# Patient Record
Sex: Male | Born: 1990 | Race: Black or African American | Hispanic: No | Marital: Single | Smoking: Current every day smoker
Health system: Southern US, Community
[De-identification: ages and names within clinical notes are randomized; demographics above are authoritative.]

## PROBLEM LIST (undated history)

## (undated) ENCOUNTER — Emergency Department (HOSPITAL_COMMUNITY): Admission: EM | Payer: Self-pay

## (undated) DIAGNOSIS — I10 Essential (primary) hypertension: Secondary | ICD-10-CM

## (undated) DIAGNOSIS — N200 Calculus of kidney: Secondary | ICD-10-CM

## (undated) DIAGNOSIS — J40 Bronchitis, not specified as acute or chronic: Secondary | ICD-10-CM

## (undated) HISTORY — PX: HEMORROIDECTOMY: SUR656

---
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
## 2009-01-05 IMAGING — CR DG CERVICAL SPINE 2 OR 3 VIEWS
2 series · 2 of 2 positions shown · non-contrast
Comparison: none

CLINICAL DATA: Left neck pain

Cervical spine 2 view:
No previous for comparison. Straightening of the normal lordosis of the cervical
spine. No prevertebral soft tissue swelling or retropharyngeal gas. Negative for
fracture, dislocation, or other acute bone injury. No significant degenerative
change.

[view not recorded (1 of 2)]
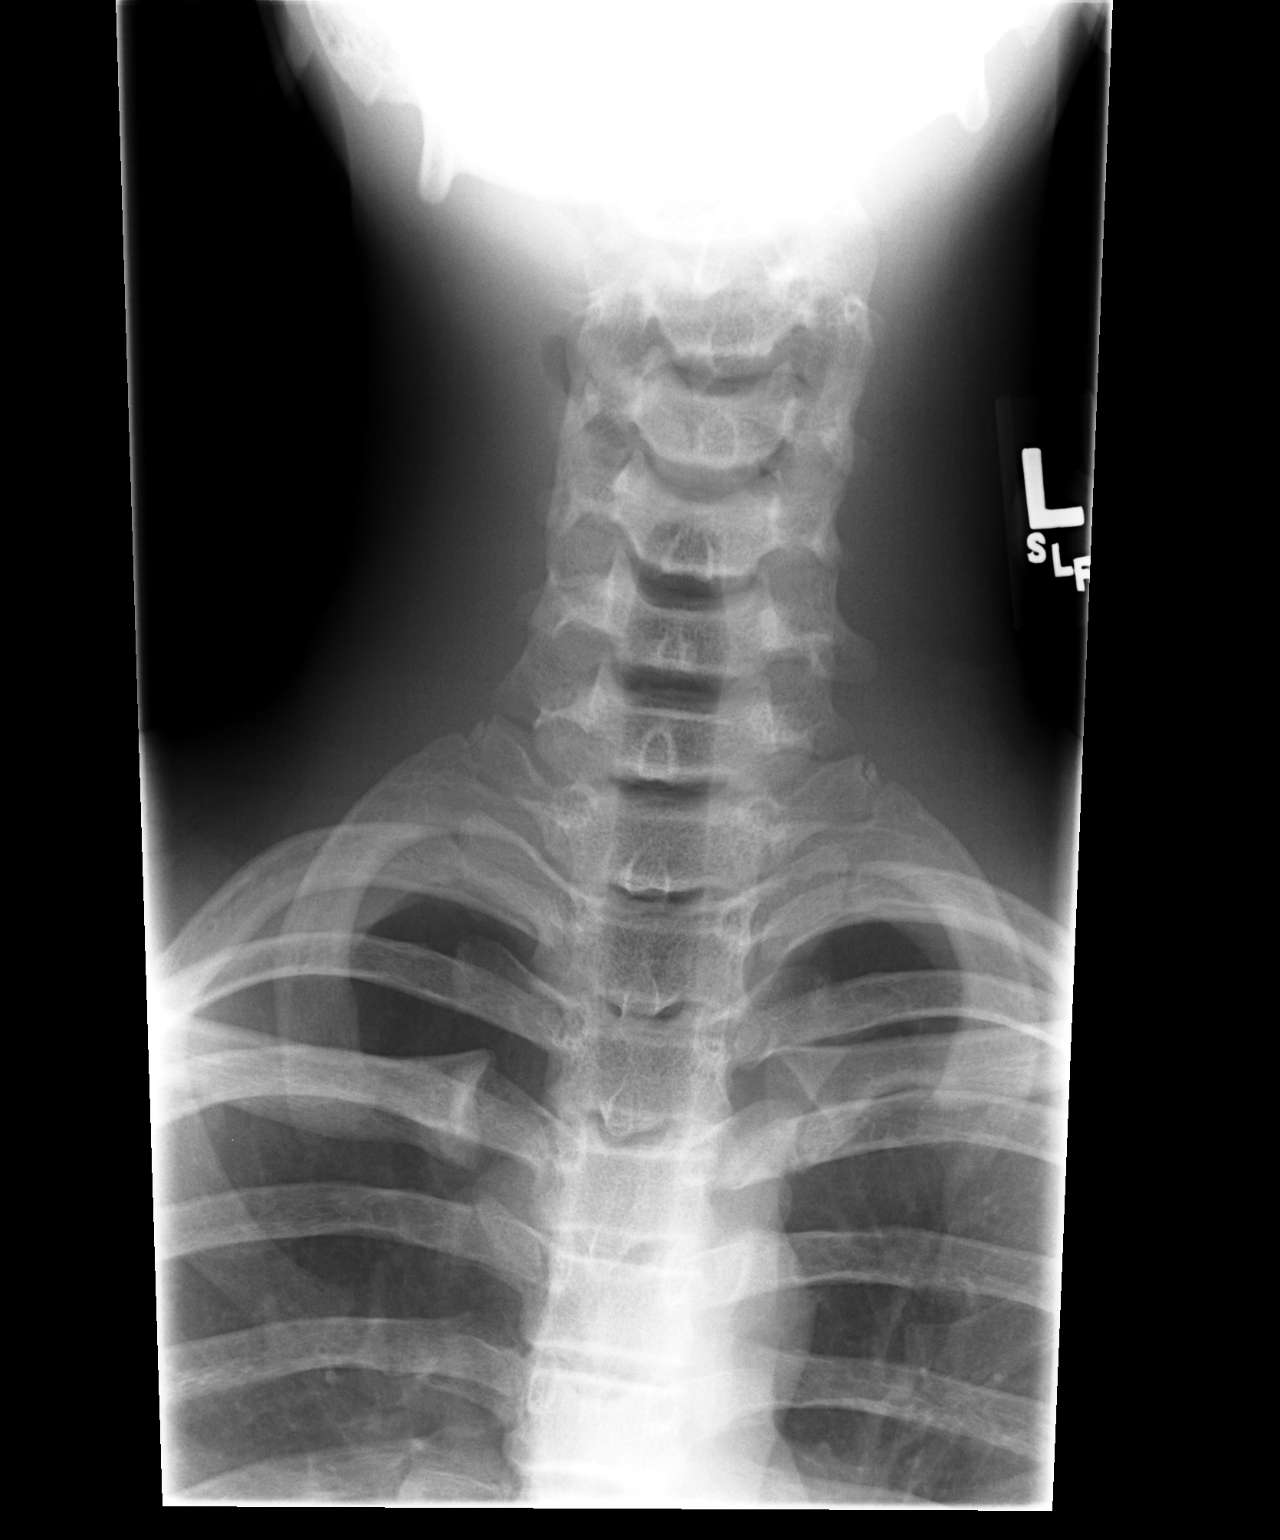

[view not recorded (2 of 2)]
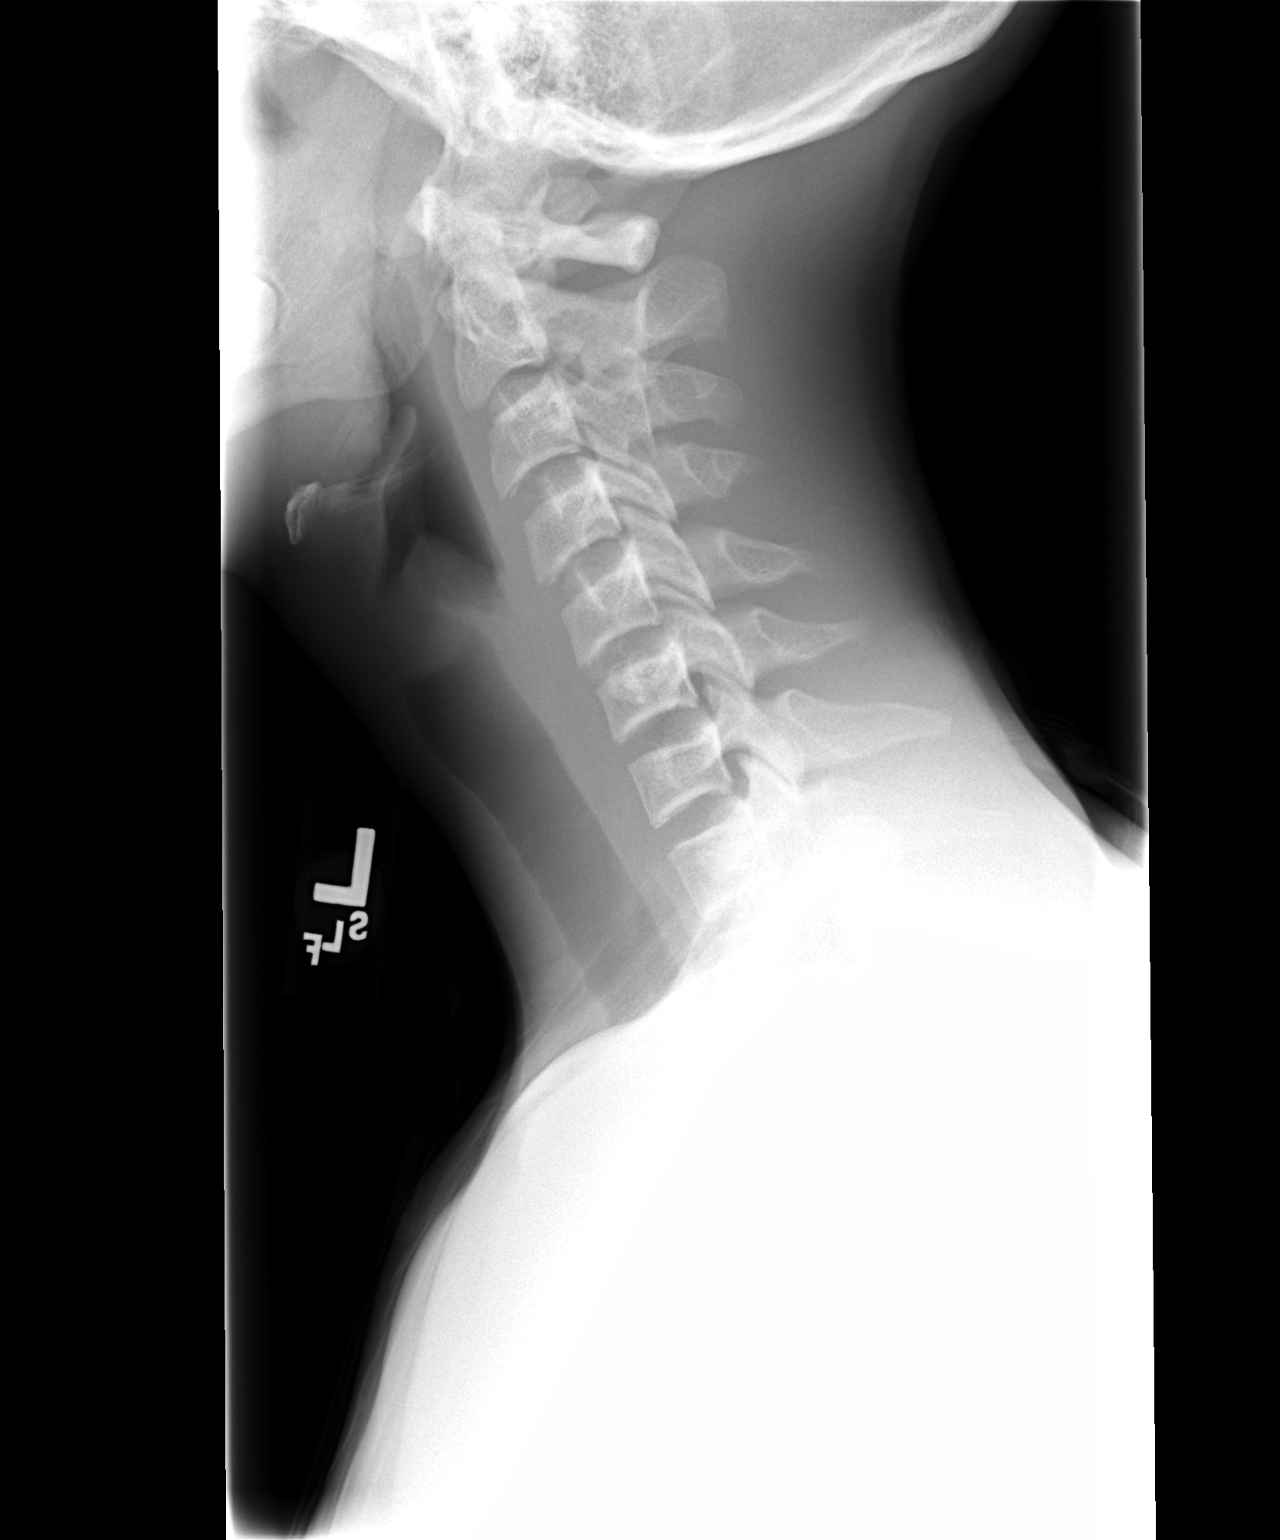

[2 of 2 positions shown; findings below may reference images not displayed]

IMPRESSION: 1. No fracture or other acute bone injury. 
2. Straightening of the normal lordosis, which may be due to spasm, position, or
soft tissue injury.

## 2012-07-17 ENCOUNTER — Emergency Department (HOSPITAL_COMMUNITY): Payer: Medicaid Other

## 2012-07-17 ENCOUNTER — Emergency Department (HOSPITAL_COMMUNITY)
Admission: EM | Admit: 2012-07-17 | Discharge: 2012-07-17 | Disposition: A | Discharge status: Home / Self Care | Payer: Medicaid Other | Attending: Emergency Medicine | Admitting: Emergency Medicine

## 2012-07-17 ENCOUNTER — Encounter (HOSPITAL_COMMUNITY): Payer: Self-pay | Admitting: *Deleted

## 2012-07-17 DIAGNOSIS — M545 Low back pain, unspecified: Secondary | ICD-10-CM | POA: Insufficient documentation

## 2012-07-17 DIAGNOSIS — M549 Dorsalgia, unspecified: Secondary | ICD-10-CM

## 2012-07-17 DIAGNOSIS — F17200 Nicotine dependence, unspecified, uncomplicated: Secondary | ICD-10-CM | POA: Insufficient documentation

## 2012-07-17 MED ORDER — DIAZEPAM 5 MG PO TABS: 5.0000 mg | ORAL_TABLET | Freq: Three times a day (TID) | ORAL | Status: DC | PRN

## 2012-07-17 MED ORDER — NAPROXEN 500 MG PO TABS: 500.0000 mg | ORAL_TABLET | Freq: Two times a day (BID) | ORAL | Status: DC

## 2012-07-17 NOTE — ED Notes (Signed)
Patient transported to X-ray 

## 2012-07-17 NOTE — ED Provider Notes (Signed)
History     CSN: [id number]  Arrival date & time 07/17/12  1245   First MD Initiated Contact with Patient 07/17/12 1326      Chief Complaint  Patient presents with  . Back Pain    (Consider location/radiation/quality/duration/timing/severity/associated sxs/prior treatment) Patient is a 21 y.o. male presenting with back pain. The history is provided by the patient.  Back Pain  This is a new problem. The current episode started less than 1 hour ago (2-3 months ago ). The problem occurs daily (in evening ). The problem has not changed since onset.Associated with: lying down flat  The pain is present in the lumbar spine. The quality of the pain is described as aching. The pain does not radiate. The pain is at a severity of 6/10. The pain is moderate. The symptoms are aggravated by bending, twisting and certain positions. The pain is worse during the night. Pertinent negatives include no chest pain, no fever, no numbness, no weight loss, no headaches, no abdominal pain, no abdominal swelling, no bowel incontinence, no perianal numbness, no bladder incontinence, no dysuria, no pelvic pain, no leg pain, no paresthesias, no paresis, no tingling and no weakness. He has tried analgesics for the symptoms. The treatment provided no relief.    History reviewed. No pertinent past medical history.  History reviewed. No pertinent past surgical history.  History reviewed. No pertinent family history.  History  Substance Use Topics  . Smoking status: Current Every Day Smoker    Types: Cigarettes  . Smokeless tobacco: Not on file  . Alcohol Use: Yes      Review of Systems  Constitutional: Negative for fever, weight loss, diaphoresis and activity change.  HENT: Negative for congestion and neck pain.   Respiratory: Negative for cough.   Cardiovascular: Negative for chest pain.  Gastrointestinal: Negative for abdominal pain and bowel incontinence.  Genitourinary: Negative for bladder  incontinence, dysuria and pelvic pain.  Musculoskeletal: Positive for back pain. Negative for myalgias and gait problem.  Skin: Negative for color change and wound.  Neurological: Negative for tingling, weakness, numbness, headaches and paresthesias.  All other systems reviewed and are negative.    Allergies  Review of patient's allergies indicates no known allergies.  Home Medications  No current outpatient prescriptions on file.  BP 144/84  Pulse 62  Temp 98.6 F (37 C) (Oral)  Resp 18  Ht 5\' 10"  (1.778 m)  Wt 135 lb (61.236 kg)  BMI 19.37 kg/m2  SpO2 100%  Physical Exam  Nursing note and vitals reviewed. Constitutional: He is oriented to person, place, and time. He appears well-developed and well-nourished. No distress.  HENT:  Head: Normocephalic and atraumatic.  Eyes: Conjunctivae normal and EOM are normal. Pupils are equal, round, and reactive to light. No scleral icterus.  Neck: Normal range of motion and full passive range of motion without pain. Neck supple. No spinous process tenderness and no muscular tenderness present. No rigidity. Normal range of motion present. No Brudzinski's sign noted.  Cardiovascular: Normal rate, regular rhythm and intact distal pulses.  Exam reveals no gallop and no friction rub.   No murmur heard. Pulmonary/Chest: Effort normal and breath sounds normal. No respiratory distress. He has no wheezes. He has no rales. He exhibits no tenderness.  Musculoskeletal:       Cervical back: He exhibits normal range of motion, no tenderness, no bony tenderness and no pain.       Thoracic back: He exhibits no tenderness, no bony tenderness and  no pain.       Lumbar back: He exhibits pain. He exhibits no tenderness, no bony tenderness, no spasm and normal pulse.       Right foot: He exhibits no swelling.       Left foot: He exhibits no swelling.       Bilateral lower extremities nontender without color change, baseline range of motion of extremities with  intact distal pulses, capillary refill less than 2 seconds bilaterally.  Pt has increased pain w ROM of lumbar spine. Pain free ambulation, no sign of ataxia.  Neurological: He is alert and oriented to person, place, and time. He has normal strength and normal reflexes. No sensory deficit. Gait normal.       Sensation at baseline for light touch in all 4 distal extremities, motor symmetric & bilateral 5/5 (hips: abduction, adduction, flexion; knee: flexion & extension; foot: dorsiflexion, plantar flexion, toes: dorsi flexion) Patellar & ankle reflexes intact.   Skin: Skin is warm and dry. No rash noted. He is not diaphoretic. No erythema. No pallor.  Psychiatric: He has a normal mood and affect.    ED Course  Procedures (including critical care time)  Labs Reviewed - No data to display Dg Chest 2 View  07/17/2012  *RADIOLOGY REPORT*  Clinical Data: Back pain.  CHEST - 2 VIEW  Comparison: None.  Findings: Heart size and vascularity are normal and the lungs are clear.  Bones are normal.  IMPRESSION: Normal exam.   Original Report Authenticated By: Francene Boyers, M.D.    Dg Lumbar Spine Complete  07/17/2012  *RADIOLOGY REPORT*  Clinical Data: Low back pain  LUMBAR SPINE - COMPLETE 4+ VIEW  Comparison: None.  Findings: There is normal alignment lumbar vertebral bodies.  No loss vertebral body height or disc height.  No oblique projections demonstrate no pars fracture. No subluxation.  Spina bifida occulta at S1.   IMPRESSION: No acute findings lumbar spine.  Essentially normal exam.   Original Report Authenticated By: Genevive Bi, M.D.      No diagnosis found.    MDM  Back pain  Patient with back pain when lying flat.  No neurological deficits and normal neuro exam.  Patient can ambulate without pain. Imaging reviewed w out acute abnormalities, discussed spina bifida occulta w attending and lateral view shows no malalignment this is not likely etiology of pain.  No loss of bowel or  bladder control. No concern for cauda equina.  No fever, night sweats, weight loss, h/o cancer, IVDU.  RICE protocol and pain medicine indicated and discussed with patient.          Jaci Carrel, [place] 07/17/12 1517

## 2012-07-17 NOTE — ED Notes (Signed)
Pt c/o lower back pain for "a few months." denies known injury. Reports unable to sleep comfortably at night due to pain.

## 2012-07-18 NOTE — ED Provider Notes (Signed)
Medical screening examination/treatment/procedure(s) were performed by non-physician practitioner and as supervising physician I was immediately available for consultation/collaboration.  Flint Melter, MD 07/18/12 [phone]

## 2014-10-21 ENCOUNTER — Emergency Department (HOSPITAL_COMMUNITY): Payer: Medicaid Other

## 2014-10-21 ENCOUNTER — Encounter (HOSPITAL_COMMUNITY): Payer: Self-pay | Admitting: *Deleted

## 2014-10-21 ENCOUNTER — Emergency Department (HOSPITAL_COMMUNITY)
Admission: EM | Admit: 2014-10-21 | Discharge: 2014-10-21 | Disposition: A | Discharge status: Home / Self Care | Payer: Medicaid Other | Attending: Emergency Medicine | Admitting: Emergency Medicine

## 2014-10-21 DIAGNOSIS — Z791 Long term (current) use of non-steroidal anti-inflammatories (NSAID): Secondary | ICD-10-CM | POA: Diagnosis not present

## 2014-10-21 DIAGNOSIS — M5489 Other dorsalgia: Secondary | ICD-10-CM

## 2014-10-21 DIAGNOSIS — R079 Chest pain, unspecified: Secondary | ICD-10-CM | POA: Diagnosis not present

## 2014-10-21 DIAGNOSIS — J40 Bronchitis, not specified as acute or chronic: Secondary | ICD-10-CM

## 2014-10-21 DIAGNOSIS — Z720 Tobacco use: Secondary | ICD-10-CM | POA: Diagnosis not present

## 2014-10-21 DIAGNOSIS — M549 Dorsalgia, unspecified: Secondary | ICD-10-CM | POA: Insufficient documentation

## 2014-10-21 DIAGNOSIS — G8929 Other chronic pain: Secondary | ICD-10-CM | POA: Diagnosis not present

## 2014-10-21 LAB — CBC
HCT: 46.6 % (ref 39.0–52.0)
Hemoglobin: 15.4 g/dL (ref 13.0–17.0)
MCH: 29.2 pg (ref 26.0–34.0)
MCHC: 33 g/dL (ref 30.0–36.0)
MCV: 88.4 fL (ref 78.0–100.0)
PLATELETS: 201 10*3/uL (ref 150–400)
RBC: 5.27 MIL/uL (ref 4.22–5.81)
RDW: 12.9 % (ref 11.5–15.5)
WBC: 5.8 10*3/uL (ref 4.0–10.5)

## 2014-10-21 LAB — BASIC METABOLIC PANEL
Anion gap: 10 (ref 5–15)
BUN: 11 mg/dL (ref 6–23)
CALCIUM: 9.8 mg/dL (ref 8.4–10.5)
CO2: 26 mmol/L (ref 19–32)
CREATININE: 0.95 mg/dL (ref 0.50–1.35)
Chloride: 104 mmol/L (ref 96–112)
GFR calc Af Amer: >90 mL/min (ref >90)
Glucose, Bld: 87 mg/dL (ref 70–99)
POTASSIUM: 4.5 mmol/L (ref 3.5–5.1)
Sodium: 140 mmol/L (ref 135–145)

## 2014-10-21 LAB — I-STAT TROPONIN, ED: Troponin i, poc: 0 ng/mL (ref 0.00–0.08)

## 2014-10-21 MED ORDER — HYDROCODONE-ACETAMINOPHEN 5-325 MG PO TABS: 1.0000 | ORAL_TABLET | Freq: Four times a day (QID) | ORAL | Status: DC | PRN

## 2014-10-21 MED ORDER — HYDROCODONE-ACETAMINOPHEN 5-325 MG PO TABS
2.0000 | ORAL_TABLET | Freq: Once | ORAL | Status: AC
  Administered 2014-10-21: 2 via ORAL
  Filled 2014-10-21: qty 2

## 2014-10-21 MED ORDER — ALBUTEROL SULFATE HFA 108 (90 BASE) MCG/ACT IN AERS: 1.0000 | INHALATION_SPRAY | Freq: Four times a day (QID) | RESPIRATORY_TRACT | Status: DC | PRN

## 2014-10-21 MED ORDER — IBUPROFEN 800 MG PO TABS: 800.0000 mg | ORAL_TABLET | Freq: Three times a day (TID) | ORAL | Status: DC

## 2014-10-21 MED ORDER — AZITHROMYCIN 250 MG PO TABS: ORAL_TABLET | ORAL | Status: DC

## 2014-10-21 MED ORDER — IPRATROPIUM-ALBUTEROL 0.5-2.5 (3) MG/3ML IN SOLN
3.0000 mL | Freq: Once | RESPIRATORY_TRACT | Status: AC
  Administered 2014-10-21: 3 mL via RESPIRATORY_TRACT
  Filled 2014-10-21: qty 3

## 2014-10-21 NOTE — ED Provider Notes (Addendum)
CSN: [id number]     Arrival date & time 10/21/14  1153 History   First MD Initiated Contact with Patient 10/21/14 1428     Chief Complaint  Patient presents with  . Back Pain  . Chest Pain     (Consider location/radiation/quality/duration/timing/severity/associated sxs/prior Treatment) The history is provided by the patient.  Scott Wheeler is a 24 y.o. male smoker here with chest pain, back pain. Chest pain for years. It is intermittent, usually worse at night. Over the last week, he has been having some cough and wheezing at night. Denies fever. Denies abdominal pain. Also has more back pain that wakes him up at night. Denies leg numbness or weakness. Denies shortness of breath, denies passing out and denies worse with exertion. No hx of CAD and no family hx of CAD.    History reviewed. No pertinent past medical history. History reviewed. No pertinent past surgical history. History reviewed. No pertinent family history. History  Substance Use Topics  . Smoking status: Current Every Day Smoker    Types: Cigarettes  . Smokeless tobacco: Not on file  . Alcohol Use: Yes    Review of Systems  Cardiovascular: Positive for chest pain.  Musculoskeletal: Positive for back pain.  All other systems reviewed and are negative.     Allergies  Review of patient's allergies indicates no known allergies.  Home Medications   Prior to Admission medications   Medication Sig Start Date End Date Taking? Authorizing Provider  diazepam (VALIUM) 5 MG tablet Take 1 tablet (5 mg total) by mouth every 8 (eight) hours as needed for anxiety. Patient not taking: Reported on 10/21/2014 07/17/12   Lisette Paz, PA-C  naproxen (NAPROSYN) 500 MG tablet Take 1 tablet (500 mg total) by mouth 2 (two) times daily. Patient not taking: Reported on 10/21/2014 07/17/12   Lisette Paz, PA-C  naproxen sodium (ANAPROX) 220 MG tablet Take 220 mg by mouth 2 (two) times daily with a meal.   Yes Historical Provider, MD    BP 133/85 mmHg  Pulse 55  Temp(Src) 97.6 F (36.4 C) (Oral)  Resp 23  SpO2 100% Physical Exam  Constitutional: He is oriented to person, place, and time. He appears well-developed and well-nourished.  HENT:  Head: Normocephalic.  Mouth/Throat: Oropharynx is clear and moist.  Eyes: Conjunctivae are normal. Pupils are equal, round, and reactive to light.  Neck: Normal range of motion. Neck supple.  Cardiovascular: Normal rate, regular rhythm and normal heart sounds.   Pulmonary/Chest:  Diminished breath sounds throughout. ? Mild wheezing throughout  Abdominal: Soft. Bowel sounds are normal. He exhibits no distension. There is no tenderness. There is no rebound.  Musculoskeletal: Normal range of motion. He exhibits no edema or tenderness.  Neurological: He is alert and oriented to person, place, and time. No cranial nerve deficit. Coordination normal.  Skin: Skin is warm and dry.  Psychiatric: He has a normal mood and affect. His behavior is normal. Judgment and thought content normal.  Nursing note and vitals reviewed.   ED Course  Procedures (including critical care time) Labs Review Labs Reviewed  CBC  BASIC METABOLIC PANEL  Rosezena SensorI-STAT TROPOININ, ED    Imaging Review Dg Chest 2 View  10/21/2014   CLINICAL DATA:  Chest pain, wheezing  EXAM: CHEST  2 VIEW  COMPARISON:  07/17/2012  FINDINGS: Lungs are clear.  No pleural effusion or pneumothorax.  The heart is normal in size.  Visualized osseous structures are within normal limits.  IMPRESSION: Normal chest  radiographs.   Electronically Signed   By: Charline Bills M.D.   On: 10/21/2014 12:54   Dg Lumbar Spine Complete  10/21/2014   CLINICAL DATA:  Pt states that he has had lower lumbar pain for the last 2 years. Pt states that it is hard for him to breath deep and he has a hard time sleeping at night. No known injury.  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  No fracture. No spondylolisthesis. There are no degenerative changes.  Soft tissues are unremarkable.  IMPRESSION: 1. No acute findings. 2. Transitional vertebrae. No other abnormalities. Stable appearance from the prior study.   Electronically Signed   By: Amie Portland M.D.   On: 10/21/2014 15:49     EKG Interpretation   Date/Time:  Sunday October 21 2014 15:13:50 EDT Ventricular Rate:  50 PR Interval:  179 QRS Duration: 83 QT Interval:  402 QTC Calculation: 366 R Axis:   89 Text Interpretation:  Sinus rhythm RSR' in V1 or V2, probably normal  variant LVH by voltage ST elevation suggests acute pericarditis No  significant change since last tracing Confirmed by Naasir Carreira  MD, Yarielys Beed ([id number])  on 10/21/2014 3:33:31 PM      MDM   Final diagnoses:  None   Scott Wheeler is a 24 y.o. male here with chest pain, back pain. Chronic pain, neuro exam unremarkable. Likely super imposed bronchitis. Given that he is a smoker, will likely give a course of zpack.   4:50 PM cxr unremarkable. Improved air movement now. Initial EKG showed nonspecific TW changes but repeat normal. No syncope and no active chest pain and symptoms for years so I doubt ACS. Will dc with albuterol, zpack, prn vicodin.     Richardean Canal, MD 10/21/14 1650  Richardean Canal, MD 10/21/14 [phone]

## 2014-10-21 NOTE — ED Notes (Signed)
Pt in Radiology 

## 2014-10-21 NOTE — Discharge Instructions (Signed)
Take motrin for pain.   Take vicodin for severe pain. See an orthopedic doctor.   Use albuterol for wheezing and trouble breathing.   Take zpack as prescribed.   See your regular doctor.   Return to ER if you have worse chest pain, shortness of breath, fever, wheezing, back pain.

## 2014-10-21 NOTE — ED Notes (Signed)
Pt reports diffuse chest pain for several years. Becomes worse on exertion. Reports chest tightness upon arrival to exam room. Rating 3/10. Respirations unlabored. Pt is alert and oriented x4.

## 2014-10-21 NOTE — ED Notes (Signed)
Pt reports that he has hx of back pain, has been seen here before for the back pain but no improvement. Also now having mid sharp chest pains that occur more with movement. No acute distress noted at triage.

## 2014-11-13 ENCOUNTER — Emergency Department (HOSPITAL_COMMUNITY)
Admission: EM | Admit: 2014-11-13 | Discharge: 2014-11-13 | Disposition: A | Discharge status: Home / Self Care | Payer: Medicaid Other | Attending: Emergency Medicine | Admitting: Emergency Medicine

## 2014-11-13 ENCOUNTER — Encounter (HOSPITAL_COMMUNITY): Payer: Self-pay | Admitting: *Deleted

## 2014-11-13 ENCOUNTER — Emergency Department (HOSPITAL_COMMUNITY): Payer: Medicaid Other

## 2014-11-13 DIAGNOSIS — I10 Essential (primary) hypertension: Secondary | ICD-10-CM | POA: Insufficient documentation

## 2014-11-13 DIAGNOSIS — Z720 Tobacco use: Secondary | ICD-10-CM | POA: Insufficient documentation

## 2014-11-13 DIAGNOSIS — N201 Calculus of ureter: Secondary | ICD-10-CM | POA: Insufficient documentation

## 2014-11-13 DIAGNOSIS — Z792 Long term (current) use of antibiotics: Secondary | ICD-10-CM | POA: Diagnosis not present

## 2014-11-13 DIAGNOSIS — R1030 Lower abdominal pain, unspecified: Secondary | ICD-10-CM | POA: Diagnosis present

## 2014-11-13 DIAGNOSIS — Z8709 Personal history of other diseases of the respiratory system: Secondary | ICD-10-CM | POA: Diagnosis not present

## 2014-11-13 DIAGNOSIS — Z79899 Other long term (current) drug therapy: Secondary | ICD-10-CM | POA: Diagnosis not present

## 2014-11-13 DIAGNOSIS — R109 Unspecified abdominal pain: Secondary | ICD-10-CM

## 2014-11-13 HISTORY — DX: Bronchitis, not specified as acute or chronic: J40

## 2014-11-13 HISTORY — DX: Essential (primary) hypertension: I10

## 2014-11-13 LAB — URINALYSIS, ROUTINE W REFLEX MICROSCOPIC
BILIRUBIN URINE: NEGATIVE
Glucose, UA: NEGATIVE mg/dL
KETONES UR: NEGATIVE mg/dL
NITRITE: NEGATIVE
PH: 6 (ref 5.0–8.0)
PROTEIN: NEGATIVE mg/dL
SPECIFIC GRAVITY, URINE: 1.026 (ref 1.005–1.030)
UROBILINOGEN UA: 1 mg/dL (ref 0.0–1.0)

## 2014-11-13 LAB — URINE MICROSCOPIC-ADD ON

## 2014-11-13 MED ORDER — IBUPROFEN 800 MG PO TABS: 800.0000 mg | ORAL_TABLET | Freq: Three times a day (TID) | ORAL | Status: DC

## 2014-11-13 MED ORDER — HYDROMORPHONE HCL 1 MG/ML IJ SOLN
1.0000 mg | Freq: Once | INTRAMUSCULAR | Status: AC
  Administered 2014-11-13: 1 mg via INTRAVENOUS
  Filled 2014-11-13: qty 1

## 2014-11-13 MED ORDER — OXYCODONE-ACETAMINOPHEN 5-325 MG PO TABS: 1.0000 | ORAL_TABLET | ORAL | Status: DC | PRN

## 2014-11-13 MED ORDER — PROMETHAZINE HCL 25 MG PO TABS: 25.0000 mg | ORAL_TABLET | Freq: Four times a day (QID) | ORAL | Status: DC | PRN

## 2014-11-13 MED ORDER — KETOROLAC TROMETHAMINE 30 MG/ML IJ SOLN
30.0000 mg | Freq: Once | INTRAMUSCULAR | Status: AC
  Administered 2014-11-13: 30 mg via INTRAVENOUS
  Filled 2014-11-13: qty 1

## 2014-11-13 MED ORDER — TAMSULOSIN HCL 0.4 MG PO CAPS: 0.4000 mg | ORAL_CAPSULE | Freq: Two times a day (BID) | ORAL | Status: DC

## 2014-11-13 NOTE — ED Notes (Signed)
Patient transported to CT 

## 2014-11-13 NOTE — ED Provider Notes (Signed)
mCSN: [id number]     Arrival date & time 11/13/14  0500 History   First MD Initiated Contact with Patient 11/13/14 [phone]     Chief Complaint  Patient presents with  . Flank Pain     (Consider location/radiation/quality/duration/timing/severity/associated sxs/prior Treatment) HPI Comments: 24 year old male who presents with acute onset of left flank pain which occurred just prior to arrival when he was sleeping, the pain is sharp and stabbing, severe, 8 out of 10, seems to be somewhat positional but also also intermittent regardless of position. Had associated nausea and vomiting prior to arrival. The pain seems to radiate to the left lower quadrant. No history of kidney stones though he does have a history of chronic low back pain. He feels that this is different  Patient is a 24 y.o. male presenting with flank pain. The history is provided by the patient.  Flank Pain    Past Medical History  Diagnosis Date  . Bronchitis   . Hypertension    History reviewed. No pertinent past surgical history. History reviewed. No pertinent family history. History  Substance Use Topics  . Smoking status: Current Every Day Smoker    Types: Cigarettes  . Smokeless tobacco: Not on file  . Alcohol Use: Yes    Review of Systems  Genitourinary: Positive for flank pain.  All other systems reviewed and are negative.     Allergies  Review of patient's allergies indicates no known allergies.  Home Medications   Prior to Admission medications   Medication Sig Start Date End Date Taking? Authorizing Provider  albuterol (PROVENTIL HFA;VENTOLIN HFA) 108 (90 BASE) MCG/ACT inhaler Inhale 1-2 puffs into the lungs every 6 (six) hours as needed for wheezing or shortness of breath. 10/21/14  Yes Richardean Canal, MD  HYDROcodone-acetaminophen (NORCO/VICODIN) 5-325 MG per tablet Take 1 tablet by mouth every 6 (six) hours as needed. 10/21/14  Yes Richardean Canal, MD  azithromycin (ZITHROMAX Z-PAK) 250 MG tablet 2 po  day one, then 1 daily x 4 days Patient not taking: Reported on 11/13/2014 10/21/14   Richardean Canal, MD  diazepam (VALIUM) 5 MG tablet Take 1 tablet (5 mg total) by mouth every 8 (eight) hours as needed for anxiety. Patient not taking: Reported on 10/21/2014 07/17/12   Lisette Paz, PA-C  ibuprofen (ADVIL,MOTRIN) 800 MG tablet Take 1 tablet (800 mg total) by mouth 3 (three) times daily. 11/13/14   Eber Hong, MD  naproxen (NAPROSYN) 500 MG tablet Take 1 tablet (500 mg total) by mouth 2 (two) times daily. Patient not taking: Reported on 10/21/2014 07/17/12   Jaci Carrel, PA-C  oxyCODONE-acetaminophen (PERCOCET) 5-325 MG per tablet Take 1 tablet by mouth every 4 (four) hours as needed. 11/13/14   Eber Hong, MD  promethazine (PHENERGAN) 25 MG tablet Take 1 tablet (25 mg total) by mouth every 6 (six) hours as needed for nausea or vomiting. 11/13/14   Eber Hong, MD  tamsulosin (FLOMAX) 0.4 MG CAPS capsule Take 1 capsule (0.4 mg total) by mouth 2 (two) times daily. 11/13/14   Eber Hong, MD   BP 147/108 mmHg  Pulse 63  Temp(Src) 97.4 F (36.3 C) (Oral)  Resp 22  Ht  (1.803 m)  SpO2 99% Physical Exam  Constitutional: He appears well-developed and well-nourished. No distress.  HENT:  Head: Normocephalic and atraumatic.  Mouth/Throat: Oropharynx is clear and moist. No oropharyngeal exudate.  Eyes: Conjunctivae and EOM are normal. Pupils are equal, round, and reactive to light. Right eye exhibits  no discharge. Left eye exhibits no discharge. No scleral icterus.  Neck: Normal range of motion. Neck supple. No JVD present. No thyromegaly present.  Cardiovascular: Normal rate, regular rhythm, normal heart sounds and intact distal pulses.  Exam reveals no gallop and no friction rub.   No murmur heard. Pulmonary/Chest: Effort normal and breath sounds normal. No respiratory distress. He has no wheezes. He has no rales.  Abdominal: Soft. Bowel sounds are normal. He exhibits no distension and no mass.  There is no tenderness.  Musculoskeletal: Normal range of motion. He exhibits no edema or tenderness.  Lymphadenopathy:    He has no cervical adenopathy.  Neurological: He is alert. Coordination normal.  Skin: Skin is warm and dry. No rash noted. No erythema.  Psychiatric: He has a normal mood and affect. His behavior is normal.  Nursing note and vitals reviewed.   ED Course  Procedures (including critical care time) Labs Review Labs Reviewed  URINALYSIS, ROUTINE W REFLEX MICROSCOPIC - Abnormal; Notable for the following:    Hgb urine dipstick MODERATE (*)    Leukocytes, UA TRACE (*)    All other components within normal limits  URINE MICROSCOPIC-ADD ON - Abnormal; Notable for the following:    Casts HYALINE CASTS (*)    All other components within normal limits    Imaging Review Ct Renal Stone Study  11/13/2014   CLINICAL DATA:  Sudden onset of left flank pain.  EXAM: CT ABDOMEN AND PELVIS WITHOUT CONTRAST  TECHNIQUE: Multidetector CT imaging of the abdomen and pelvis was performed following the standard protocol without IV contrast.  COMPARISON:  None.  FINDINGS: BODY WALL: No contributory findings.  LOWER CHEST: No contributory findings.  ABDOMEN/PELVIS:  Liver: No focal abnormality.  Biliary: No evidence of biliary obstruction or stone.  Pancreas: Unremarkable.  Spleen: Unremarkable.  Adrenals: Unremarkable.  Kidneys and ureters: Probable 2 mm stone in the distal left ureter, with uncertainty related to paucity of abdominal fat and difficult visualization of the ureter. There is no hydronephrosis.  Bilateral nephrolithiasis, more extensive on the right with interpolar stone measuring up to 4 mm. No right hydronephrosis.  Bladder: Unremarkable.  Reproductive: No pathologic findings.  Bowel: No obstruction.  Retroperitoneum: No mass or adenopathy.  Peritoneum: No ascites or pneumoperitoneum.  Vascular: No acute abnormality.  OSSEOUS: No acute abnormalities.  IMPRESSION: 1. Probable  nonobstructive 2 mm stone in the distal left ureter. Uncertainty is related to paucity of intra-abdominal fat. 2. Bilateral nephrolithiasis.   Electronically Signed   By: Marnee Spring M.D.   On: 11/13/2014 06:39      MDM   Final diagnoses:  Ureteral stone    The patient has left CVA tenderness which is mild, no abdominal tenderness or guarding, bedside ultrasound reveals no obvious hydronephrosis, story consistent with kidney stone, check CT scan and urinalysis.  Emergency Focused Ultrasound Exam Limited retroperitoneal ultrasound of kidneys  Performed and interpreted by Dr. Hyacinth Meeker Indication: flank pain Focused abdominal ultrasound with both kidneys imaged in transverse and longitudinal planes in real-time. Interpretation: No obvious hydronephrosis visualized.   Images archived electronically  CT scan confirms that the patient has a distal 2 mm nonobstructing kidney stone, urinalysis shows hematuria without infection, patient informed of the results, feels better with medications, will be discharged with the medications as below.  Meds given in ED:  Medications  ketorolac (TORADOL) 30 MG/ML injection 30 mg (30 mg Intravenous Given 11/13/14 0556)  HYDROmorphone (DILAUDID) injection 1 mg (1 mg Intravenous Given 11/13/14 0558)  New Prescriptions   IBUPROFEN (ADVIL,MOTRIN) 800 MG TABLET    Take 1 tablet (800 mg total) by mouth 3 (three) times daily.   OXYCODONE-ACETAMINOPHEN (PERCOCET) 5-325 MG PER TABLET    Take 1 tablet by mouth every 4 (four) hours as needed.   PROMETHAZINE (PHENERGAN) 25 MG TABLET    Take 1 tablet (25 mg total) by mouth every 6 (six) hours as needed for nausea or vomiting.   TAMSULOSIN (FLOMAX) 0.4 MG CAPS CAPSULE    Take 1 capsule (0.4 mg total) by mouth 2 (two) times daily.        Eber HongBrian Rayla Pember, MD 11/13/14 [phone]

## 2014-11-13 NOTE — Discharge Instructions (Signed)
Please call your doctor for a followup appointment within 24-48 hours. When you talk to your doctor please let them know that you were seen in the emergency department and have them acquire all of your records so that they can discuss the findings with you and formulate a treatment plan to fully care for your new and ongoing problems. ° °

## 2014-11-13 NOTE — ED Notes (Signed)
Pt arrived by ems for onset this am of left flank pain, denies urinary symptoms.

## 2014-11-25 ENCOUNTER — Encounter (HOSPITAL_COMMUNITY): Payer: Self-pay | Admitting: Vascular Surgery

## 2014-11-25 ENCOUNTER — Emergency Department (HOSPITAL_COMMUNITY)
Admission: EM | Admit: 2014-11-25 | Discharge: 2014-11-25 | Disposition: A | Discharge status: Home / Self Care | Payer: Medicaid Other | Attending: Emergency Medicine | Admitting: Emergency Medicine

## 2014-11-25 ENCOUNTER — Emergency Department (HOSPITAL_COMMUNITY): Payer: Medicaid Other

## 2014-11-25 DIAGNOSIS — Z8709 Personal history of other diseases of the respiratory system: Secondary | ICD-10-CM | POA: Insufficient documentation

## 2014-11-25 DIAGNOSIS — E876 Hypokalemia: Secondary | ICD-10-CM | POA: Diagnosis not present

## 2014-11-25 DIAGNOSIS — N201 Calculus of ureter: Secondary | ICD-10-CM | POA: Insufficient documentation

## 2014-11-25 DIAGNOSIS — Z79899 Other long term (current) drug therapy: Secondary | ICD-10-CM | POA: Insufficient documentation

## 2014-11-25 DIAGNOSIS — Z791 Long term (current) use of non-steroidal anti-inflammatories (NSAID): Secondary | ICD-10-CM | POA: Diagnosis not present

## 2014-11-25 DIAGNOSIS — Z720 Tobacco use: Secondary | ICD-10-CM | POA: Diagnosis not present

## 2014-11-25 DIAGNOSIS — I10 Essential (primary) hypertension: Secondary | ICD-10-CM | POA: Insufficient documentation

## 2014-11-25 DIAGNOSIS — M545 Low back pain: Secondary | ICD-10-CM | POA: Diagnosis present

## 2014-11-25 DIAGNOSIS — R109 Unspecified abdominal pain: Secondary | ICD-10-CM

## 2014-11-25 LAB — URINALYSIS, ROUTINE W REFLEX MICROSCOPIC
Bilirubin Urine: NEGATIVE
Glucose, UA: NEGATIVE mg/dL
Ketones, ur: NEGATIVE mg/dL
NITRITE: NEGATIVE
Protein, ur: 30 mg/dL — AB
SPECIFIC GRAVITY, URINE: 1.024 (ref 1.005–1.030)
Urobilinogen, UA: 1 mg/dL (ref 0.0–1.0)
pH: 6 (ref 5.0–8.0)

## 2014-11-25 LAB — CBC
HEMATOCRIT: 41.9 % (ref 39.0–52.0)
Hemoglobin: 14.1 g/dL (ref 13.0–17.0)
MCH: 28.8 pg (ref 26.0–34.0)
MCHC: 33.7 g/dL (ref 30.0–36.0)
MCV: 85.5 fL (ref 78.0–100.0)
Platelets: 160 10*3/uL (ref 150–400)
RBC: 4.9 MIL/uL (ref 4.22–5.81)
RDW: 12.3 % (ref 11.5–15.5)
WBC: 4.9 10*3/uL (ref 4.0–10.5)

## 2014-11-25 LAB — I-STAT CHEM 8, ED
BUN: 14 mg/dL (ref 6–23)
CHLORIDE: 96 mmol/L (ref 96–112)
Calcium, Ion: 1.12 mmol/L (ref 1.12–1.23)
Creatinine, Ser: 1.2 mg/dL (ref 0.50–1.35)
GLUCOSE: 80 mg/dL (ref 70–99)
HCT: 46 % (ref 39.0–52.0)
Hemoglobin: 15.6 g/dL (ref 13.0–17.0)
Potassium: 3 mmol/L — ABNORMAL LOW (ref 3.5–5.1)
SODIUM: 141 mmol/L (ref 135–145)
TCO2: 31 mmol/L (ref 0–100)

## 2014-11-25 LAB — URINE MICROSCOPIC-ADD ON

## 2014-11-25 MED ORDER — PROMETHAZINE HCL 25 MG PO TABS: 25.0000 mg | ORAL_TABLET | Freq: Four times a day (QID) | ORAL | Status: DC | PRN

## 2014-11-25 MED ORDER — ONDANSETRON 4 MG PO TBDP
4.0000 mg | ORAL_TABLET | Freq: Once | ORAL | Status: AC
  Administered 2014-11-25: 4 mg via ORAL
  Filled 2014-11-25: qty 1

## 2014-11-25 MED ORDER — TAMSULOSIN HCL 0.4 MG PO CAPS: 0.4000 mg | ORAL_CAPSULE | Freq: Every day | ORAL | Status: DC

## 2014-11-25 MED ORDER — KETOROLAC TROMETHAMINE 60 MG/2ML IM SOLN
60.0000 mg | Freq: Once | INTRAMUSCULAR | Status: AC
  Administered 2014-11-25: 60 mg via INTRAMUSCULAR
  Filled 2014-11-25: qty 2

## 2014-11-25 MED ORDER — POTASSIUM CHLORIDE CRYS ER 20 MEQ PO TBCR
40.0000 meq | EXTENDED_RELEASE_TABLET | Freq: Once | ORAL | Status: AC
  Administered 2014-11-25: 40 meq via ORAL
  Filled 2014-11-25: qty 2

## 2014-11-25 NOTE — ED Provider Notes (Signed)
CSN: [id number]     Arrival date & time 11/25/14  1059 History   First MD Initiated Contact with Patient 11/25/14 1101     Chief Complaint  Patient presents with  . Back Pain     (Consider location/radiation/quality/duration/timing/severity/associated sxs/prior Treatment) HPI  Scott Wheeler is a 24 y.o. male with PMH of hypertension, nephrolithiasis presenting with right and left sided low back pain for last couple days that is intermittent and difficult to describe. He denies alleviating or aggravating factors. He is also had associated nausea and vomiting with 2 episodes of nonbloody nonbilious emesis today. He reports subjective chills but no fevers. He denies any urinary symptoms or hematuria. He was evaluated in the ED 10 days ago and diagnosed with 2 mm left ureteral stone. He denies passing stone. He has not followed up with urology since he lost his discharge paper in his move. No saddle anesthesia, loss of control of bladder or bowel. No numbness tingling or weakness in lower extremities.    Past Medical History  Diagnosis Date  . Bronchitis   . Hypertension    History reviewed. No pertinent past surgical history. No family history on file. History  Substance Use Topics  . Smoking status: Current Every Day Smoker    Types: Cigarettes  . Smokeless tobacco: Not on file  . Alcohol Use: Yes    Review of Systems 10 Systems reviewed and are negative for acute change except as noted in the HPI.    Allergies  Review of patient's allergies indicates no known allergies.  Home Medications   Prior to Admission medications   Medication Sig Start Date End Date Taking? Authorizing Provider  HYDROcodone-acetaminophen (NORCO/VICODIN) 5-325 MG per tablet Take 1 tablet by mouth every 6 (six) hours as needed. 10/21/14  Yes Richardean Canal, MD  ibuprofen (ADVIL,MOTRIN) 800 MG tablet Take 1 tablet (800 mg total) by mouth 3 (three) times daily. 11/13/14  Yes Eber Hong, MD  albuterol  (PROVENTIL HFA;VENTOLIN HFA) 108 (90 BASE) MCG/ACT inhaler Inhale 1-2 puffs into the lungs every 6 (six) hours as needed for wheezing or shortness of breath. Patient not taking: Reported on 11/25/2014 10/21/14   Richardean Canal, MD  azithromycin (ZITHROMAX Z-PAK) 250 MG tablet 2 po day one, then 1 daily x 4 days Patient not taking: Reported on 11/13/2014 10/21/14   Richardean Canal, MD  diazepam (VALIUM) 5 MG tablet Take 1 tablet (5 mg total) by mouth every 8 (eight) hours as needed for anxiety. Patient not taking: Reported on 10/21/2014 07/17/12   Lisette Paz, PA-C  naproxen (NAPROSYN) 500 MG tablet Take 1 tablet (500 mg total) by mouth 2 (two) times daily. Patient not taking: Reported on 10/21/2014 07/17/12   Jaci Carrel, PA-C  oxyCODONE-acetaminophen (PERCOCET) 5-325 MG per tablet Take 1 tablet by mouth every 4 (four) hours as needed. Patient not taking: Reported on 11/25/2014 11/13/14   Eber Hong, MD  promethazine (PHENERGAN) 25 MG tablet Take 1 tablet (25 mg total) by mouth every 6 (six) hours as needed for nausea or vomiting. 11/25/14   Oswaldo Conroy, PA-C  tamsulosin (FLOMAX) 0.4 MG CAPS capsule Take 1 capsule (0.4 mg total) by mouth daily after supper. 11/25/14   Oswaldo Conroy, PA-C   BP 115/69 mmHg  Pulse 62  Temp(Src) 98.2 F (36.8 C) (Oral)  Resp 16  SpO2 100% Physical Exam  Constitutional: He appears well-developed and well-nourished. No distress.  HENT:  Head: Normocephalic and atraumatic.  Mouth/Throat: Oropharynx is clear  and moist.  Eyes: Conjunctivae and EOM are normal. Right eye exhibits no discharge. Left eye exhibits no discharge.  Cardiovascular: Normal rate and regular rhythm.   Pulmonary/Chest: Effort normal and breath sounds normal. No respiratory distress. He has no wheezes.  Abdominal: Soft. Bowel sounds are normal. He exhibits no distension. There is no tenderness.  Musculoskeletal:  No midline back tenderness, step off or crepitus. Right Left sided lower to mid back  tenderness. No CVA tenderness.  Neurological: He is alert. He exhibits normal muscle tone. Coordination normal.  Equal muscle tone. 5/5 strength in lower extremities. DTR equal and intact.  Normal gait.  Skin: Skin is warm and dry. He is not diaphoretic.  Nursing note and vitals reviewed.   ED Course  Procedures (including critical care time) Labs Review Labs Reviewed  URINALYSIS, ROUTINE W REFLEX MICROSCOPIC - Abnormal; Notable for the following:    Color, Urine AMBER (*)    APPearance TURBID (*)    Hgb urine dipstick LARGE (*)    Protein, ur 30 (*)    Leukocytes, UA SMALL (*)    All other components within normal limits  I-STAT CHEM 8, ED - Abnormal; Notable for the following:    Potassium 3.0 (*)    All other components within normal limits  CBC  URINE MICROSCOPIC-ADD ON    Imaging Review [place] Renal  11/25/2014   CLINICAL DATA:  Back pain.  EXAM: RENAL/URINARY TRACT ULTRASOUND COMPLETE  COMPARISON:  CT 11/13/2014.  FINDINGS: Right Kidney:  Length: 10.2 cm. Cortical echotexture is within normal limits. 5 mm inferior pole stone. No hydronephrosis. No dilation of the renal pelvis or proximal ureter.  Left Kidney:  Length: 11.0 cm. Cortical echotexture within normal limits. Renal calculi are present. At least 2 stones are present in the upper and lower pole measuring 2 mm. No obstruction.  Bladder:  Nearly completely collapsed.  Grossly normal.  IMPRESSION: Bilateral renal collecting system calculi without hydronephrosis or an acute abnormality.   Electronically Signed   By: Andreas NewportGeoffrey  Lamke M.D.   On: 11/25/2014 14:26     EKG Interpretation None      MDM   Final diagnoses:  Ureterolithiasis  Bilateral low back pain, with sciatica presence unspecified  Right flank pain   Patient with history of kidney stones presenting with reported nausea and vomiting for 2 days as well as persistent pain. He has not followed up with urology. VSS. No fever. No abdominal tenderness. No nausea  vomiting NAD. Patient tolerated fluids without difficulty. Patient with hypo-kalemia which was orally supplemented.  No abnormalities in kidney function. Ultrasound without evidence of hydronephrosis. Patient nontoxic nonseptic and stable for discharge. Refilled patient's Phenergan and Flomax and he is to follow-up with urology.  Discussed return precautions with patient. Discussed all results and patient verbalizes understanding and agrees with plan.      Oswaldo ConroyVictoria Ante Arredondo, PA-C 11/25/14 1713  Layla MawKristen N Ward, DO 11/25/14 1714

## 2014-11-25 NOTE — ED Notes (Signed)
Pt reports to the ED for eval of right sided and lower back pain. He has a hx of nephrolithiasis. Pt also reports N/V with this pain x 2 days. States any time he eats any thing he vomits. Pt reports he has been taking Ibuprofen but it is not helping. He reports the pain is constant and sharp. Laying on the right side is worse. Also reports fevers and chills. Pt denies any urinary symptoms. Denies any numbness, tingling, paralysis, or bowel or bladder changes. Pt A&Ox4, resp e/u, and skin warm and dry.

## 2014-11-25 NOTE — Discharge Instructions (Signed)
Return to the emergency room with worsening of symptoms, new symptoms or with symptoms that are concerning , especially fevers, loss of control of bladder or bowels, numbness or tingling around genital region or anus, weakness OR , especially fevers, abdominal pain in one area, unable to keep down fluids, blood in stool or vomit, severe pain, you feel faint, lightheaded or pass out. Follow up with urology. Call to make appointment. Please call your doctor for a followup appointment within 24-48 hours. When you talk to your doctor please let them know that you were seen in the emergency department and have them acquire all of your records so that they can discuss the findings with you and formulate a treatment plan to fully care for your new and ongoing problems. If you do not have a primary care provider please call the number below under ED resources to establish care with a provider and follow up.    Emergency Department Resource Guide 1) Find a Doctor and Pay Out of Pocket Although you won't have to find out who is covered by your insurance plan, it is a good idea to ask around and get recommendations. You will then need to call the office and see if the doctor you have chosen will accept you as a new patient and what types of options they offer for patients who are self-pay. Some doctors offer discounts or will set up payment plans for their patients who do not have insurance, but you will need to ask so you aren't surprised when you get to your appointment.  2) Contact Your Local Health Department Not all health departments have doctors that can see patients for sick visits, but many do, so it is worth a call to see if yours does. If you don't know where your local health department is, you can check in your phone book. The CDC also has a tool to help you locate your state's health department, and many state websites also have listings of all of their local health departments.  3) Find a Walk-in  Clinic If your illness is not likely to be very severe or complicated, you may want to try a walk in clinic. These are popping up all over the country in pharmacies, drugstores, and shopping centers. They're usually staffed by nurse practitioners or physician assistants that have been trained to treat common illnesses and complaints. They're usually fairly quick and inexpensive. However, if you have serious medical issues or chronic medical problems, these are probably not your best option.  No Primary Care Doctor: - Call Health Connect at  [phone] - they can help you locate a primary care doctor that  accepts your insurance, provides certain services, etc. - Physician Referral Service- [phone]  Chronic Pain Problems: Organization         Address  Phone   Notes  [facility]  [phone]) [phone] Patients need to be referred by their primary care doctor.   Medication Assistance: Organization         Address  Phone   Notes  [facility] [address] [place], [place] [address] [phone] --Must be a resident of [place] -- Must have NO insurance coverage whatsoever (no Medicaid/ Medicare, etc.) -- The pt. MUST have a primary care doctor that directs their care regularly and follows them in the community   [facility]  [phone]   [employer]  [phone]    Agencies that provide inexpensive  medical care: Organization         Address  Phone   Notes  [facility]  [phone]   [facility]    [phone]   [facility] [address] [place], [place] [address] [phone]   [facility] [address] [place] [phone]   Planned Parenthood    [phone]   [facility]    [phone]   [facility]  [address], [place] Phone:  [phone], Fax:  [phone] Hours  of Operation:  9 am - 6 pm, M-F.  Also accepts Medicaid/Medicare and self-pay.  [facility]  [address], [place] Phone: [phone], Fax: [phone]. Hours of Operation:  8:30 am - 5:30 pm, M-F.  Also accepts Medicaid and self-pay.  [facility] [place] [address], [place] Phone: [phone]   [facility] [address] Natasha Bence [place], [place] [phone], Ext. [phone] Mondays & Thursdays: 7-9 AM.  First 15 patients are seen on a first come, first serve basis.    Medicaid-accepting [facility] Providers:  Organization         Address  Phone   Notes  [facility] [address], [place] [phone] Also accepts self-pay patients.  [facility] [address] Laurell Josephs [place], [place]  [phone]   [facility] [address], [place] [phone]   [facility] [address], [place] [phone]   Renaye Rakers [address], [place]   [phone] Only accepts [place] patients after they have their name applied to their card.   Self-Pay (no insurance) in [facility]:  Organization         Address  Phone   Notes  Sickle Cell Patients, [facility] [address] [place], [place] [phone]   [facility] [address] [place], [place] [phone]   [facility] [place]  [address], [place] [phone]   [facility]/Dr. Osei-Bonsu  [address] [place] or [address], [place] [phone] Phone number for both [place] and [place] locations is the same.  [facility] [address] [place] [phone]   [facility] [address] [place] or [address] [phone]   [facility] [address], [place] [phone], phone; [phone], fax Sees patients 1st and 3rd Saturday of every month.  Must not qualify for public or private insurance (i.e. Medicaid, Medicare, [place] Health Choice, Veterans' Benefits)  Household income should be no more than 200% of the poverty level The clinic cannot treat you if you are pregnant or think you are pregnant  Sexually transmitted diseases are not treated at the clinic.    Dental Care: Organization         Address  Phone  Notes  [facility] [address] [place], [place] [phone] Accepts children up to age 48 who are enrolled in [place] or [place] Health Choice; pregnant women with a Medicaid card; and children who have applied for Medicaid or [place] Health Choice, but  were declined, whose parents can pay a reduced fee at time of service.  [facility] of [facility]  [address], [place] [phone] Accepts children up to age 38 who are enrolled in [place] or [place] Health Choice; pregnant women with a Medicaid card; and children who have applied for Medicaid or [place] Health Choice, but were declined, whose parents can pay a reduced fee at time of service.  [facility]  [address] [place], [place] [phone] Patients are seen by appointment only. Walk-ins are not accepted. [facility] will see patients 30 years of age and older. Monday - Tuesday (8am-5pm) Most Wednesdays (8:30-5pm) $30 per visit, cash only  [facility] Adult Dental Access PRO[facility]  [address], [facility] [phone] Patients are seen by appointment only. Walk-ins are not accepted. [facility] will see patients 55 years of age and older. One Wednesday Evening (Monthly: Volunteer Based).  $30 per visit, cash only  [employer]  [phone] for adults; Children under age 64, call Graduate  Pediatric Dentistry at [phone]. Children aged 47-14, please call [phone] to request a pediatric application.  Dental services are provided in all areas of dental care including fillings, crowns and bridges, complete and partial dentures, implants, gum treatment, root canals, and extractions. Preventive care is also provided. Treatment is provided to both adults and children. Patients are selected via a lottery and there is often a waiting list.   [facility] [address], [place]  [phone] [email or web address]   [facility] [address] [place], [place] [phone], Ext. [phone] Second and Fourth Thursday of each month, opens at 6:30 AM; Clinic ends at 9 AM.  Patients are seen on a first-come first-served basis, and a limited number are seen during each clinic.   [facility]  [address] Ether Griffins [place], [place] [phone]   Eligibility Requirements You must have lived in [place], [place], or [place] counties for at least the last three months.   You cannot be eligible for state or federal sponsored [place], including [employer], [place], or [employer].   You generally cannot be eligible for healthcare insurance through your employer.    How to apply: Eligibility screenings are held every Tuesday and Wednesday afternoon from 1:00 pm until 4:00 pm. You do not need an appointment for the interview!  [facility] [address], [place], [place] [phone]   [facility] Health Department  [phone]   [facility] Health Department  [phone]   [facility] Health Department  [phone]    Behavioral Health Resources in the Community: Intensive Outpatient Programs Organization         Address  Phone  Notes  [facility] Services [address] [place], [place] [phone]   [facility] [address] [place], [place]  [phone]   [facility]: [facility] Drug S[facility] [address] [place], [place]  [phone]   [facility] [address]  [place], [place] [phone] or [phone]   Substance Abuse Resources Organization         Address  Phone  Notes  Alcohol [facility] Drug Services  [phone]   [facility]  [phone]   [facility] [place]  [phone]   Floydene Flock  [phone]   Residential & Outpatient Substance Abuse Program  [phone]   Psychological Services  Organization         Address  Phone  Notes  [facility]  [phone]- [phone]   [facility]  [phone]- [phone]   [facility] [address], [place] [phone] or [phone]    Mobile Crisis Teams Organization         Address  Phone  Notes  [facility], Mobile Crisis Care Unit  [phone]   [facility]  [address] [place], [place] [phone]   Doristine LocksSharon DeEsch [address] [place] [phone]    Self-Help/Support Groups Organization         Address  Phone             Notes  [facility] [place] - variety of support groups  336- [phone] Call for more information  Narcotics Anonymous (NA), [facility] [address], [place]  2 meetings at this location   Statisticianesidential Treatment Programs Organization         Address  Phone  Notes  [facility] [address],    [place]  [phone]   [facility]  [address] [place] [phone], [place], [place] [phone]   [facility] [address], [place] [phone] Admissions: 8am-3pm M-F  [facility] [address]    [place], [place] [phone]   [facility] [address], [place], [place] [phone]   [facility] [address]  [place], [place] [phone]   [facility] - Intensive Outpatient [address], [place], [place] [phone]   [facility] ([facility] [address]  [place], [place] [phone] or [phone]   [facility] ([facility]) [address] [place], [place] [phone] Accepts Medicaid  [facility] [address]  [place] [phone] Substance Abuse/Addiction Treatment   [facility] Behavioral Health Resources Organization         Address  Phone  Notes  [facility]  [phone]   Angie FavaJulie Brannon, PhD [address] [place], [place]   [phone]) [phone] or [phone](336) [phone]   [facility]   [address] [place], [place] [phone](336) [phone]   [facility] [address], [place], [place] [phone]) [phone] Insurance/Medicaid/sponsorship through [facility]  [facility]ith [facility] Families [address]                                    [place], [place] [phone]) [phone] Therapy/tele-psych/case  [facility] [address].   [place], [place] [phone]) [phone]    Dr. Lolly MustacheArfeen  [phone]) [phone]   [facility]  [employer] [facility] 1) [address], [place] 2) [address], [place] 3)  [address], [place] [phone](336) [phone]  [phone](336) [phone]   [facility] Child Abuse Hotline [phone]) [phone] or [phone](336) [phone] (After Hours)

## 2015-02-21 ENCOUNTER — Emergency Department (HOSPITAL_COMMUNITY): Payer: Medicaid Other

## 2015-02-21 ENCOUNTER — Encounter (HOSPITAL_COMMUNITY): Payer: Self-pay | Admitting: Emergency Medicine

## 2015-02-21 ENCOUNTER — Emergency Department (HOSPITAL_COMMUNITY)
Admission: EM | Admit: 2015-02-21 | Discharge: 2015-02-21 | Disposition: A | Discharge status: Home / Self Care | Payer: Medicaid Other | Attending: Emergency Medicine | Admitting: Emergency Medicine

## 2015-02-21 ENCOUNTER — Encounter (HOSPITAL_COMMUNITY): Payer: Self-pay

## 2015-02-21 ENCOUNTER — Emergency Department (HOSPITAL_COMMUNITY)
Admission: EM | Admit: 2015-02-21 | Discharge: 2015-02-21 | Disposition: A | Discharge status: Home / Self Care | Payer: Medicaid Other | Source: Home / Self Care | Attending: Emergency Medicine | Admitting: Emergency Medicine

## 2015-02-21 DIAGNOSIS — R109 Unspecified abdominal pain: Secondary | ICD-10-CM | POA: Diagnosis present

## 2015-02-21 DIAGNOSIS — I10 Essential (primary) hypertension: Secondary | ICD-10-CM | POA: Insufficient documentation

## 2015-02-21 DIAGNOSIS — N201 Calculus of ureter: Secondary | ICD-10-CM | POA: Diagnosis not present

## 2015-02-21 DIAGNOSIS — Z720 Tobacco use: Secondary | ICD-10-CM | POA: Insufficient documentation

## 2015-02-21 DIAGNOSIS — Z8709 Personal history of other diseases of the respiratory system: Secondary | ICD-10-CM

## 2015-02-21 DIAGNOSIS — N200 Calculus of kidney: Secondary | ICD-10-CM | POA: Insufficient documentation

## 2015-02-21 DIAGNOSIS — Z79899 Other long term (current) drug therapy: Secondary | ICD-10-CM | POA: Insufficient documentation

## 2015-02-21 HISTORY — DX: Calculus of kidney: N20.0

## 2015-02-21 LAB — URINE MICROSCOPIC-ADD ON

## 2015-02-21 LAB — CBC
HCT: 42.5 % (ref 39.0–52.0)
Hemoglobin: 14.3 g/dL (ref 13.0–17.0)
MCH: 28.5 pg (ref 26.0–34.0)
MCHC: 33.6 g/dL (ref 30.0–36.0)
MCV: 84.7 fL (ref 78.0–100.0)
Platelets: 173 K/uL (ref 150–400)
RBC: 5.02 MIL/uL (ref 4.22–5.81)
RDW: 12.5 % (ref 11.5–15.5)
WBC: 11.4 K/uL — ABNORMAL HIGH (ref 4.0–10.5)

## 2015-02-21 LAB — BASIC METABOLIC PANEL
ANION GAP: 10 (ref 5–15)
BUN: 11 mg/dL (ref 6–20)
CO2: 24 mmol/L (ref 22–32)
Calcium: 9 mg/dL (ref 8.9–10.3)
Chloride: 104 mmol/L (ref 101–111)
Creatinine, Ser: 1.4 mg/dL — ABNORMAL HIGH (ref 0.61–1.24)
GFR calc Af Amer: >60 mL/min (ref >60)
Glucose, Bld: 91 mg/dL (ref 65–99)
POTASSIUM: 3.6 mmol/L (ref 3.5–5.1)
Sodium: 138 mmol/L (ref 135–145)

## 2015-02-21 LAB — URINALYSIS, ROUTINE W REFLEX MICROSCOPIC
BILIRUBIN URINE: NEGATIVE
GLUCOSE, UA: NEGATIVE mg/dL
KETONES UR: NEGATIVE mg/dL
LEUKOCYTES UA: NEGATIVE
Nitrite: NEGATIVE
PH: 6.5 (ref 5.0–8.0)
Protein, ur: NEGATIVE mg/dL
Specific Gravity, Urine: 1.016 (ref 1.005–1.030)
Urobilinogen, UA: 0.2 mg/dL (ref 0.0–1.0)

## 2015-02-21 MED ORDER — KETOROLAC TROMETHAMINE 30 MG/ML IJ SOLN
30.0000 mg | Freq: Once | INTRAMUSCULAR | Status: AC
  Administered 2015-02-21: 30 mg via INTRAVENOUS
  Filled 2015-02-21: qty 1

## 2015-02-21 MED ORDER — TAMSULOSIN HCL 0.4 MG PO CAPS: 0.4000 mg | ORAL_CAPSULE | Freq: Every day | ORAL | Status: DC

## 2015-02-21 MED ORDER — ONDANSETRON HCL 4 MG PO TABS: 4.0000 mg | ORAL_TABLET | Freq: Four times a day (QID) | ORAL | Status: DC

## 2015-02-21 MED ORDER — HYDROMORPHONE HCL 1 MG/ML IJ SOLN
0.5000 mg | Freq: Once | INTRAMUSCULAR | Status: AC
  Administered 2015-02-21: 0.5 mg via INTRAVENOUS
  Filled 2015-02-21: qty 1

## 2015-02-21 MED ORDER — HYDROMORPHONE HCL 1 MG/ML IJ SOLN
1.0000 mg | Freq: Once | INTRAMUSCULAR | Status: AC
  Administered 2015-02-21: 1 mg via INTRAVENOUS
  Filled 2015-02-21: qty 1

## 2015-02-21 MED ORDER — ONDANSETRON HCL 4 MG/2ML IJ SOLN
4.0000 mg | Freq: Once | INTRAMUSCULAR | Status: AC
  Administered 2015-02-21: 4 mg via INTRAVENOUS
  Filled 2015-02-21: qty 2

## 2015-02-21 MED ORDER — HYDROMORPHONE HCL 1 MG/ML IJ SOLN: 0.5000 mg | Freq: Once | INTRAMUSCULAR | Status: DC

## 2015-02-21 MED ORDER — SODIUM CHLORIDE 0.9 % IV BOLUS (SEPSIS)
1000.0000 mL | Freq: Once | INTRAVENOUS | Status: AC
  Administered 2015-02-21: 1000 mL via INTRAVENOUS

## 2015-02-21 MED ORDER — OXYCODONE-ACETAMINOPHEN 5-325 MG PO TABS: 1.0000 | ORAL_TABLET | ORAL | Status: DC | PRN

## 2015-02-21 NOTE — ED Provider Notes (Signed)
CSN: [id number]     Arrival date & time 02/21/15  4098 History   First MD Initiated Contact with Patient 02/21/15 [phone]     Chief Complaint  Patient presents with  . Flank Pain     (Consider location/radiation/quality/duration/timing/severity/associated sxs/prior Treatment) Patient is a 24 y.o. male presenting with flank pain. The history is provided by the patient. No language interpreter was used.  Flank Pain This is a new problem. The current episode started yesterday. Associated symptoms include nausea and vomiting. Pertinent negatives include no chills or fever. Associated symptoms comments: Right flank pain for the past 1 day associated with nausea and vomiting. No fever. He denies hematuria, dysuria, fever. He has had kidney stones and reports he feels this is what is causing current symptoms. .    Past Medical History  Diagnosis Date  . Bronchitis   . Hypertension    History reviewed. No pertinent past surgical history. History reviewed. No pertinent family history. History  Substance Use Topics  . Smoking status: Current Every Day Smoker    Types: Cigarettes  . Smokeless tobacco: Not on file  . Alcohol Use: Yes    Review of Systems  Constitutional: Negative for fever and chills.  Respiratory: Negative.   Cardiovascular: Negative.   Gastrointestinal: Positive for nausea and vomiting.  Genitourinary: Positive for flank pain. Negative for dysuria and hematuria.  Musculoskeletal: Negative.   Skin: Negative.   Neurological: Negative.       Allergies  Strawberry  Home Medications   Prior to Admission medications   Medication Sig Start Date End Date Taking? Authorizing Provider  albuterol (PROVENTIL HFA;VENTOLIN HFA) 108 (90 BASE) MCG/ACT inhaler Inhale 1-2 puffs into the lungs every 6 (six) hours as needed for wheezing or shortness of breath. Patient not taking: Reported on 11/25/2014 10/21/14   Richardean Canal, MD  azithromycin (ZITHROMAX Z-PAK) 250 MG tablet 2 po day  one, then 1 daily x 4 days Patient not taking: Reported on 11/13/2014 10/21/14   Richardean Canal, MD  diazepam (VALIUM) 5 MG tablet Take 1 tablet (5 mg total) by mouth every 8 (eight) hours as needed for anxiety. Patient not taking: Reported on 10/21/2014 07/17/12   Jaci Carrel, PA-C  HYDROcodone-acetaminophen (NORCO/VICODIN) 5-325 MG per tablet Take 1 tablet by mouth every 6 (six) hours as needed. Patient not taking: Reported on 02/21/2015 10/21/14   Richardean Canal, MD  ibuprofen (ADVIL,MOTRIN) 800 MG tablet Take 1 tablet (800 mg total) by mouth 3 (three) times daily. Patient not taking: Reported on 02/21/2015 11/13/14   Eber Hong, MD  naproxen (NAPROSYN) 500 MG tablet Take 1 tablet (500 mg total) by mouth 2 (two) times daily. Patient not taking: Reported on 10/21/2014 07/17/12   Jaci Carrel, PA-C  oxyCODONE-acetaminophen (PERCOCET) 5-325 MG per tablet Take 1 tablet by mouth every 4 (four) hours as needed. Patient not taking: Reported on 11/25/2014 11/13/14   Eber Hong, MD  promethazine (PHENERGAN) 25 MG tablet Take 1 tablet (25 mg total) by mouth every 6 (six) hours as needed for nausea or vomiting. Patient not taking: Reported on 02/21/2015 11/25/14   Oswaldo Conroy, PA-C  tamsulosin (FLOMAX) 0.4 MG CAPS capsule Take 1 capsule (0.4 mg total) by mouth daily after supper. Patient not taking: Reported on 02/21/2015 11/25/14   Oswaldo Conroy, PA-C   BP 135/78 mmHg  Pulse 48  SpO2 100% Physical Exam  Constitutional: He is oriented to person, place, and time. He appears well-developed and well-nourished.  Uncomfortable appearing.  Neck: Normal range of motion.  Pulmonary/Chest: Effort normal.  Abdominal: Soft. There is no tenderness. There is no rebound and no guarding.  Musculoskeletal: Normal range of motion.  Neurological: He is alert and oriented to person, place, and time.  Skin: Skin is warm. He is diaphoretic.  Psychiatric: He has a normal mood and affect.    ED Course  Procedures (including  critical care time) Labs Review Labs Reviewed  URINALYSIS, ROUTINE W REFLEX MICROSCOPIC (NOT AT [facility])   Results for orders placed or performed during the hospital encounter of 02/21/15  Urinalysis, Routine w reflex microscopic (not at [facility])  Result Value Ref Range   Color, Urine YELLOW YELLOW   APPearance HAZY (A) CLEAR   Specific Gravity, Urine 1.016 1.005 - 1.030   pH 6.5 5.0 - 8.0   Glucose, UA NEGATIVE NEGATIVE mg/dL   Hgb urine dipstick LARGE (A) NEGATIVE   Bilirubin Urine NEGATIVE NEGATIVE   Ketones, ur NEGATIVE NEGATIVE mg/dL   Protein, ur NEGATIVE NEGATIVE mg/dL   Urobilinogen, UA 0.2 0.0 - 1.0 mg/dL   Nitrite NEGATIVE NEGATIVE   Leukocytes, UA NEGATIVE NEGATIVE  Urine microscopic-add on  Result Value Ref Range   Squamous Epithelial / LPF RARE RARE   WBC, UA 3-6 <3 WBC/hpf   RBC / HPF TOO NUMEROUS TO COUNT <3 RBC/hpf   Bacteria, UA RARE RARE   US Renal  02/21/2015   CLINICAL DATA:  Right flank pain of uncertain duration, evaluate for possible ureteral stone. , history of urinary tract stones.  EXAM: RENAL / URINARY TRACT ULTRASOUND COMPLETE  COMPARISON:  Renal ultrasound of November 25, 2014  FINDINGS: Right Kidney:  Length: 11.3 . The cortical echogenicity is normal. There is mild hydronephrosis. There is an approximately 5 mm x 7 mm x 4 mm echogenic focus in the midpole.  Left Kidney:  Length: 11.5 cm. There is no hydronephrosis. There is a 4 mm diameter upper pole nonobstructing stone.  Bladder:  There is debris in the urinary bladder. There are ureteral jets demonstrated bilaterally.  IMPRESSION: There bilateral subcentimeter kidney stones. There is mild right-sided hydronephrosis. Bilateral ureteral jets are demonstrated.   Electronically Signed   By: David  [place] M.D.   On: 02/21/2015 09:14     Imaging Review No results found.   EKG Interpretation None      MDM   Final diagnoses:  None    1. Ureteral stone  The patient's pain is controlled in ED. No further  vomiting. He appears comfortable. Has ureteral stone on [place], without obstruction. Will refer to urology for outpatient management.     Elpidio Anis, PA-C 02/21/15 1610  Derwood Kaplan, MD 02/22/15 2216

## 2015-02-21 NOTE — ED Notes (Signed)
PA aware of pts HR 

## 2015-02-21 NOTE — Discharge Instructions (Signed)
Dietary Guidelines to Help Prevent Kidney Stones °Your risk of kidney stones can be decreased by adjusting the foods you eat. The most important thing you can do is drink enough fluid. You should drink enough fluid to keep your urine clear or pale yellow. The following guidelines provide specific information for the type of kidney stone you have had. °GUIDELINES ACCORDING TO TYPE OF KIDNEY STONE °Calcium Oxalate Kidney Stones °· Reduce the amount of salt you eat. Foods that have a lot of salt cause your body to release excess calcium into your urine. The excess calcium can combine with a substance called oxalate to form kidney stones. °· Reduce the amount of animal protein you eat if the amount you eat is excessive. Animal protein causes your body to release excess calcium into your urine. Ask your dietitian how much protein from animal sources you should be eating. °· Avoid foods that are high in oxalates. If you take vitamins, they should have less than 500 mg of vitamin C. Your body turns vitamin C into oxalates. You do not need to avoid fruits and vegetables high in vitamin C. °Calcium Phosphate Kidney Stones °· Reduce the amount of salt you eat to help prevent the release of excess calcium into your urine. °· Reduce the amount of animal protein you eat if the amount you eat is excessive. Animal protein causes your body to release excess calcium into your urine. Ask your dietitian how much protein from animal sources you should be eating. °· Get enough calcium from food or take a calcium supplement (ask your dietitian for recommendations). Food sources of calcium that do not increase your risk of kidney stones include: °¨ Broccoli. °¨ Dairy products, such as cheese and yogurt. °¨ Pudding. °Uric Acid Kidney Stones °· Do not have more than 6 oz of animal protein per day. °FOOD SOURCES °Animal Protein Sources °· Meat (all types). °· Poultry. °· Eggs. °· Fish, seafood. °Foods High in Salt °· Salt seasonings. °· Soy  sauce. °· Teriyaki sauce. °· Cured and processed meats. °· Salted crackers and snack foods. °· Fast food. °· Canned soups and most canned foods. °Foods High in Oxalates °· Grains: °¨ Amaranth. °¨ Barley. °¨ Grits. °¨ Wheat germ. °¨ Bran. °¨ Buckwheat flour. °¨ All bran cereals. °¨ Pretzels. °¨ Whole wheat bread. °· Vegetables: °¨ Beans (wax). °¨ Beets and beet greens. °¨ Collard greens. °¨ Eggplant. °¨ Escarole. °¨ Leeks. °¨ Okra. °¨ Parsley. °¨ Rutabagas. °¨ Spinach. °¨ Swiss chard. °¨ Tomato paste. °¨ Fried potatoes. °¨ Sweet potatoes. °· Fruits: °¨ Red currants. °¨ Figs. °¨ Kiwi. °¨ Rhubarb. °· Meat and Other Protein Sources: °¨ Beans (dried). °¨ Soy burgers and other soybean products. °¨ Miso. °¨ Nuts (peanuts, almonds, pecans, cashews, hazelnuts). °¨ Nut butters. °¨ Sesame seeds and tahini (paste made of sesame seeds). °¨ Poppy seeds. °· Beverages: °¨ Chocolate drink mixes. °¨ Soy milk. °¨ Instant iced tea. °¨ Juices made from high-oxalate fruits or vegetables. °· Other: °¨ Carob. °¨ Chocolate. °¨ Fruitcake. °¨ Marmalades. °Document Released: 11/14/2010 Document Revised: 07/25/2013 Document Reviewed: 06/16/2013 °ExitCare® Patient Information ©2015 [employer]. This information is not intended to replace advice given to you by your health care provider. Make sure you discuss any questions you have with your health care provider. ° °Kidney Stones °Kidney stones (urolithiasis) are solid masses that form inside your kidneys. The intense pain is caused by the stone moving through the kidney, ureter, bladder, and urethra (urinary tract). When the stone moves, the   ureter starts to spasm around the stone. The stone is usually passed in your pee (urine).  °HOME CARE °· Drink enough fluids to keep your pee clear or pale yellow. This helps to get the stone out. °· Strain all pee through the provided strainer. Do not pee without peeing through the strainer, not even once. If you pee the stone out, catch it in the  strainer. The stone may be as small as a grain of salt. Take this to your doctor. This will help your doctor figure out what you can do to try to prevent more kidney stones. °· Only take medicine as told by your doctor. °· Follow up with your doctor as told. °· Get follow-up X-rays as told by your doctor. °GET HELP IF: °You have pain that gets worse even if you have been taking pain medicine. °GET HELP RIGHT AWAY IF:  °· Your pain does not get better with medicine. °· You have a fever or shaking chills. °· Your pain increases and gets worse over 18 hours. °· You have new belly (abdominal) pain. °· You feel faint or pass out. °· You are unable to pee. °MAKE SURE YOU:  °· Understand these instructions. °· Will watch your condition. °· Will get help right away if you are not doing well or get worse. °Document Released: 01/06/2008 Document Revised: 03/22/2013 Document Reviewed: 12/21/2012 °ExitCare® Patient Information ©2015 [employer]. This information is not intended to replace advice given to you by your health care provider. Make sure you discuss any questions you have with your health care provider. ° °

## 2015-02-21 NOTE — Discharge Instructions (Signed)

## 2015-02-21 NOTE — ED Notes (Signed)
Per EMS - pt had back pain yesterday, worsening overnight to general right-sided flank pain radiating to groin. IV 20G LFA. Given fentanyl & 4 zofran with relief.

## 2015-02-21 NOTE — ED Notes (Signed)
Asked pt for urine sample.  

## 2015-02-21 NOTE — ED Notes (Signed)
Treated here earlier today for right sided kidney stone and sent home.  Unable to take percocet for pain due to nausea and vomiting.  Continues to have right flank pain.

## 2015-02-21 NOTE — ED Notes (Signed)
Pt diaphoretic in room.  Pt c/o intense pain to right flank.  Pt writhing in bed.  PA made aware.  Pt provided with a cool rag.    Pt also provided with a urinal and encouraged to urinate when possible

## 2015-02-21 NOTE — ED Provider Notes (Signed)
CSN: [id number]     Arrival date & time 02/21/15  2124 History   First MD Initiated Contact with Patient 02/21/15 2127     Chief Complaint  Patient presents with  . Abdominal Pain  . Emesis  . Nausea     (Consider location/radiation/quality/duration/timing/severity/associated sxs/prior Treatment) Patient is a 24 y.o. male presenting with abdominal pain and vomiting. The history is provided by the patient.  Abdominal Pain Pain location:  R flank Pain quality: aching   Pain radiates to:  Does not radiate Pain severity:  Moderate Onset quality:  Gradual Timing:  Constant Progression:  Unchanged Chronicity:  Recurrent Relieved by:  Nothing Worsened by:  Nothing tried Associated symptoms: nausea and vomiting   Associated symptoms: no cough, no fever and no shortness of breath   Emesis Associated symptoms: no abdominal pain     Past Medical History  Diagnosis Date  . Bronchitis   . Hypertension   . Kidney calculi    History reviewed. No pertinent past surgical history. No family history on file. History  Substance Use Topics  . Smoking status: Current Every Day Smoker    Types: Cigarettes  . Smokeless tobacco: Not on file  . Alcohol Use: Yes    Review of Systems  Constitutional: Negative for fever.  Respiratory: Negative for cough and shortness of breath.   Gastrointestinal: Positive for nausea and vomiting. Negative for abdominal pain.  All other systems reviewed and are negative.     Allergies  Strawberry  Home Medications   Prior to Admission medications   Medication Sig Start Date End Date Taking? Authorizing Provider  albuterol (PROVENTIL HFA;VENTOLIN HFA) 108 (90 BASE) MCG/ACT inhaler Inhale 1-2 puffs into the lungs every 6 (six) hours as needed for wheezing or shortness of breath. Patient not taking: Reported on 11/25/2014 10/21/14   Richardean Canal, MD  azithromycin (ZITHROMAX Z-PAK) 250 MG tablet 2 po day one, then 1 daily x 4 days Patient not taking:  Reported on 11/13/2014 10/21/14   Richardean Canal, MD  diazepam (VALIUM) 5 MG tablet Take 1 tablet (5 mg total) by mouth every 8 (eight) hours as needed for anxiety. Patient not taking: Reported on 10/21/2014 07/17/12   Jaci Carrel, PA-C  HYDROcodone-acetaminophen (NORCO/VICODIN) 5-325 MG per tablet Take 1 tablet by mouth every 6 (six) hours as needed. Patient not taking: Reported on 02/21/2015 10/21/14   Richardean Canal, MD  ibuprofen (ADVIL,MOTRIN) 800 MG tablet Take 1 tablet (800 mg total) by mouth 3 (three) times daily. Patient not taking: Reported on 02/21/2015 11/13/14   Eber Hong, MD  naproxen (NAPROSYN) 500 MG tablet Take 1 tablet (500 mg total) by mouth 2 (two) times daily. Patient not taking: Reported on 10/21/2014 07/17/12   Lisette Paz, PA-C  ondansetron (ZOFRAN) 4 MG tablet Take 1 tablet (4 mg total) by mouth every 6 (six) hours. 02/21/15   Elpidio Anis, PA-C  oxyCODONE-acetaminophen (PERCOCET/ROXICET) 5-325 MG per tablet Take 1-2 tablets by mouth every 4 (four) hours as needed for severe pain. 02/21/15   Elpidio Anis, PA-C  promethazine (PHENERGAN) 25 MG tablet Take 1 tablet (25 mg total) by mouth every 6 (six) hours as needed for nausea or vomiting. Patient not taking: Reported on 02/21/2015 11/25/14   Oswaldo Conroy, PA-C  tamsulosin (FLOMAX) 0.4 MG CAPS capsule Take 1 capsule (0.4 mg total) by mouth daily. 02/21/15   Shari Upstill, PA-C   BP 153/102 mmHg  Pulse 61  Temp(Src) 98.5 F (36.9 C)  Resp 18  Ht  (1.803 m)  Wt 135 lb (61.236 kg)  BMI 18.84 kg/m2  SpO2 100% Physical Exam  Constitutional: He is oriented to person, place, and time. He appears well-developed and well-nourished. No distress.  HENT:  Head: Normocephalic and atraumatic.  Mouth/Throat: No oropharyngeal exudate.  Eyes: EOM are normal. Pupils are equal, round, and reactive to light.  Neck: Normal range of motion. Neck supple.  Cardiovascular: Normal rate and regular rhythm.  Exam reveals no friction rub.   No  murmur heard. Pulmonary/Chest: Effort normal and breath sounds normal. No respiratory distress. He has no wheezes. He has no rales.  Abdominal: He exhibits no distension. There is tenderness (R flank). There is no rebound.  Musculoskeletal: Normal range of motion. He exhibits no edema.  Neurological: He is alert and oriented to person, place, and time.  Skin: He is not diaphoretic.  Nursing note and vitals reviewed.   ED Course  Procedures (including critical care time) Labs Review Labs Reviewed  CBC  BASIC METABOLIC PANEL    Imaging Review US Renal  02/21/2015   CLINICAL DATA:  Right flank pain of uncertain duration, evaluate for possible ureteral stone. , history of urinary tract stones.  EXAM: RENAL / URINARY TRACT ULTRASOUND COMPLETE  COMPARISON:  Renal ultrasound of November 25, 2014  FINDINGS: Right Kidney:  Length: 11.3 . The cortical echogenicity is normal. There is mild hydronephrosis. There is an approximately 5 mm x 7 mm x 4 mm echogenic focus in the midpole.  Left Kidney:  Length: 11.5 cm. There is no hydronephrosis. There is a 4 mm diameter upper pole nonobstructing stone.  Bladder:  There is debris in the urinary bladder. There are ureteral jets demonstrated bilaterally.  IMPRESSION: There bilateral subcentimeter kidney stones. There is mild right-sided hydronephrosis. Bilateral ureteral jets are demonstrated.   Electronically Signed   By: David  [place] M.D.   On: 02/21/2015 09:14     EKG Interpretation None      MDM   Final diagnoses:  Right flank pain  Kidney stone    24 year old male here with continued right flank pain. Diagnosed with likely kidney stone today as he had hydronephrosis on the right after on renal ultrasound. He was discharged home with pain meds and Zofran was unable to afford his medications. He did take an old Phenergan that he had at home without relief. He said persistent pain and vomiting since. His urine was negative this morning. We'll check  labs and CT. Last CT was 3 months ago showed left-sided stone. Today's symptoms are all right-sided.  CT shows 2 mm R UVJ stone. Doing well after toradol and zofran. Stable for discharge. Encouraged to get prescriptions filled.  Elwin Mocha, MD 02/21/15 [phone]

## 2015-03-19 ENCOUNTER — Encounter (HOSPITAL_COMMUNITY): Payer: Self-pay

## 2015-03-19 ENCOUNTER — Emergency Department (HOSPITAL_COMMUNITY)
Admission: EM | Admit: 2015-03-19 | Discharge: 2015-03-19 | Disposition: A | Discharge status: Home / Self Care | Payer: Medicaid Other | Attending: Emergency Medicine | Admitting: Emergency Medicine

## 2015-03-19 ENCOUNTER — Emergency Department (HOSPITAL_COMMUNITY): Payer: Medicaid Other

## 2015-03-19 DIAGNOSIS — Z79899 Other long term (current) drug therapy: Secondary | ICD-10-CM | POA: Diagnosis not present

## 2015-03-19 DIAGNOSIS — R52 Pain, unspecified: Secondary | ICD-10-CM

## 2015-03-19 DIAGNOSIS — I10 Essential (primary) hypertension: Secondary | ICD-10-CM | POA: Diagnosis not present

## 2015-03-19 DIAGNOSIS — Z8709 Personal history of other diseases of the respiratory system: Secondary | ICD-10-CM | POA: Insufficient documentation

## 2015-03-19 DIAGNOSIS — Z720 Tobacco use: Secondary | ICD-10-CM | POA: Insufficient documentation

## 2015-03-19 DIAGNOSIS — N200 Calculus of kidney: Secondary | ICD-10-CM | POA: Diagnosis not present

## 2015-03-19 DIAGNOSIS — R109 Unspecified abdominal pain: Secondary | ICD-10-CM | POA: Diagnosis present

## 2015-03-19 LAB — I-STAT CHEM 8, ED
BUN: 13 mg/dL (ref 6–20)
CHLORIDE: 102 mmol/L (ref 101–111)
Calcium, Ion: 1.2 mmol/L (ref 1.12–1.23)
Creatinine, Ser: 1.3 mg/dL — ABNORMAL HIGH (ref 0.61–1.24)
GLUCOSE: 95 mg/dL (ref 65–99)
HEMATOCRIT: 51 % (ref 39.0–52.0)
HEMOGLOBIN: 17.3 g/dL — AB (ref 13.0–17.0)
POTASSIUM: 3.3 mmol/L — AB (ref 3.5–5.1)
Sodium: 141 mmol/L (ref 135–145)
TCO2: 24 mmol/L (ref 0–100)

## 2015-03-19 LAB — URINALYSIS, ROUTINE W REFLEX MICROSCOPIC
GLUCOSE, UA: NEGATIVE mg/dL
KETONES UR: 15 mg/dL — AB
NITRITE: NEGATIVE
PH: 6 (ref 5.0–8.0)
PROTEIN: 30 mg/dL — AB
Specific Gravity, Urine: 1.026 (ref 1.005–1.030)
Urobilinogen, UA: 0.2 mg/dL (ref 0.0–1.0)

## 2015-03-19 LAB — I-STAT TROPONIN, ED: TROPONIN I, POC: 0.01 ng/mL (ref 0.00–0.08)

## 2015-03-19 LAB — URINE MICROSCOPIC-ADD ON

## 2015-03-19 MED ORDER — OXYCODONE-ACETAMINOPHEN 5-325 MG PO TABS: 1.0000 | ORAL_TABLET | Freq: Four times a day (QID) | ORAL | Status: DC | PRN

## 2015-03-19 MED ORDER — IBUPROFEN 800 MG PO TABS: 800.0000 mg | ORAL_TABLET | Freq: Three times a day (TID) | ORAL | Status: DC

## 2015-03-19 MED ORDER — ONDANSETRON HCL 4 MG/2ML IJ SOLN
4.0000 mg | Freq: Once | INTRAMUSCULAR | Status: AC | PRN
  Administered 2015-03-19: 4 mg via INTRAVENOUS
  Filled 2015-03-19: qty 2

## 2015-03-19 MED ORDER — TAMSULOSIN HCL 0.4 MG PO CAPS: 0.4000 mg | ORAL_CAPSULE | Freq: Every day | ORAL | Status: DC

## 2015-03-19 MED ORDER — TAMSULOSIN HCL 0.4 MG PO CAPS
0.4000 mg | ORAL_CAPSULE | Freq: Every day | ORAL | Status: DC
  Administered 2015-03-19: 0.4 mg via ORAL
  Filled 2015-03-19: qty 1

## 2015-03-19 MED ORDER — KETOROLAC TROMETHAMINE 30 MG/ML IJ SOLN
30.0000 mg | Freq: Once | INTRAMUSCULAR | Status: AC
  Administered 2015-03-19: 30 mg via INTRAVENOUS
  Filled 2015-03-19: qty 1

## 2015-03-19 MED ORDER — FENTANYL CITRATE (PF) 100 MCG/2ML IJ SOLN
50.0000 ug | Freq: Once | INTRAMUSCULAR | Status: AC
  Administered 2015-03-19: 50 ug via INTRAVENOUS
  Filled 2015-03-19: qty 2

## 2015-03-19 MED ORDER — OXYCODONE-ACETAMINOPHEN 5-325 MG PO TABS
1.0000 | ORAL_TABLET | Freq: Once | ORAL | Status: AC
  Administered 2015-03-19: 1 via ORAL
  Filled 2015-03-19: qty 1

## 2015-03-19 NOTE — ED Provider Notes (Signed)
CSN: [id number]     Arrival date & time 03/19/15  0409 History   First MD Initiated Contact with Patient 03/19/15 0447     Chief Complaint  Patient presents with  . Flank Pain     (Consider location/radiation/quality/duration/timing/severity/associated sxs/prior Treatment) Patient is a 24 y.o. male presenting with flank pain. The history is provided by the patient.  Flank Pain This is a recurrent problem. The current episode started more than 1 week ago. The problem occurs constantly. The problem has not changed since onset.Pertinent negatives include no chest pain, no headaches and no shortness of breath. Nothing aggravates the symptoms. Nothing relieves the symptoms. The treatment provided no relief.  percocet not helping has not seen urology  Past Medical History  Diagnosis Date  . Bronchitis   . Hypertension   . Kidney calculi    History reviewed. No pertinent past surgical history. No family history on file. Social History  Substance Use Topics  . Smoking status: Current Every Day Smoker    Types: Cigarettes  . Smokeless tobacco: None  . Alcohol Use: Yes    Review of Systems  Respiratory: Negative for shortness of breath.   Cardiovascular: Negative for chest pain.  Genitourinary: Positive for flank pain.  Neurological: Negative for headaches.  All other systems reviewed and are negative.     Allergies  Strawberry  Home Medications   Prior to Admission medications   Medication Sig Start Date End Date Taking? Authorizing Provider  albuterol (PROVENTIL HFA;VENTOLIN HFA) 108 (90 BASE) MCG/ACT inhaler Inhale 1-2 puffs into the lungs every 6 (six) hours as needed for wheezing or shortness of breath. Patient not taking: Reported on 11/25/2014 10/21/14   Richardean Canal, MD  azithromycin (ZITHROMAX Z-PAK) 250 MG tablet 2 po day one, then 1 daily x 4 days Patient not taking: Reported on 11/13/2014 10/21/14   Richardean Canal, MD  diazepam (VALIUM) 5 MG tablet Take 1 tablet (5 mg  total) by mouth every 8 (eight) hours as needed for anxiety. Patient not taking: Reported on 10/21/2014 07/17/12   Jaci Carrel, PA-C  HYDROcodone-acetaminophen (NORCO/VICODIN) 5-325 MG per tablet Take 1 tablet by mouth every 6 (six) hours as needed. Patient not taking: Reported on 02/21/2015 10/21/14   Richardean Canal, MD  ibuprofen (ADVIL,MOTRIN) 800 MG tablet Take 1 tablet (800 mg total) by mouth 3 (three) times daily. Patient not taking: Reported on 02/21/2015 11/13/14   Eber Hong, MD  naproxen (NAPROSYN) 500 MG tablet Take 1 tablet (500 mg total) by mouth 2 (two) times daily. Patient not taking: Reported on 10/21/2014 07/17/12   Lisette Paz, PA-C  ondansetron (ZOFRAN) 4 MG tablet Take 1 tablet (4 mg total) by mouth every 6 (six) hours. 02/21/15   Elpidio Anis, PA-C  oxyCODONE-acetaminophen (PERCOCET/ROXICET) 5-325 MG per tablet Take 1-2 tablets by mouth every 4 (four) hours as needed for severe pain. 02/21/15   Elpidio Anis, PA-C  promethazine (PHENERGAN) 25 MG tablet Take 1 tablet (25 mg total) by mouth every 6 (six) hours as needed for nausea or vomiting. Patient not taking: Reported on 02/21/2015 11/25/14   Oswaldo Conroy, PA-C  tamsulosin (FLOMAX) 0.4 MG CAPS capsule Take 1 capsule (0.4 mg total) by mouth daily. 02/21/15   Shari Upstill, PA-C   BP 150/96 mmHg  Pulse 64  Temp(Src) 98.2 F (36.8 C) (Oral)  Resp 18  Ht  (1.803 m)  Wt 135 lb (61.236 kg)  BMI 18.84 kg/m2  SpO2 98% Physical Exam  Constitutional:  He is oriented to person, place, and time. He appears well-developed and well-nourished. No distress.  HENT:  Head: Normocephalic and atraumatic.  Mouth/Throat: Oropharynx is clear and moist.  Eyes: Conjunctivae are normal. Pupils are equal, round, and reactive to light.  Neck: Normal range of motion. Neck supple.  Cardiovascular: Normal rate, regular rhythm and intact distal pulses.   Pulmonary/Chest: Effort normal and breath sounds normal. No respiratory distress. He has no  wheezes. He has no rales.  Abdominal: Soft. Bowel sounds are normal. There is no tenderness. There is no rebound and no guarding.  Musculoskeletal: Normal range of motion.  Neurological: He is alert and oriented to person, place, and time.  Skin: Skin is warm and dry.  Psychiatric: He has a normal mood and affect.    ED Course  Procedures (including critical care time) Labs Review Labs Reviewed  URINALYSIS, ROUTINE W REFLEX MICROSCOPIC (NOT AT [facility])  URINALYSIS, ROUTINE W REFLEX MICROSCOPIC (NOT AT [facility])  I-STAT CHEM 8, ED    Imaging Review No results found. I, Mujtaba Bollig-RASCH,Tom Ragsdale K, personally reviewed and evaluated these images and lab results as part of my medical decision-making.   EKG Interpretation None      MDM   Final diagnoses:  Pain    Results for orders placed or performed during the hospital encounter of 03/19/15  Urinalysis, Routine w reflex microscopic (not at [facility])  Result Value Ref Range   Color, Urine AMBER (A) YELLOW   APPearance CLOUDY (A) CLEAR   Specific Gravity, Urine 1.026 1.005 - 1.030   pH 6.0 5.0 - 8.0   Glucose, UA NEGATIVE NEGATIVE mg/dL   Hgb urine dipstick LARGE (A) NEGATIVE   Bilirubin Urine SMALL (A) NEGATIVE   Ketones, ur 15 (A) NEGATIVE mg/dL   Protein, ur 30 (A) NEGATIVE mg/dL   Urobilinogen, UA 0.2 0.0 - 1.0 mg/dL   Nitrite NEGATIVE NEGATIVE   Leukocytes, UA TRACE (A) NEGATIVE  Urine microscopic-add on  Result Value Ref Range   WBC, UA 3-6 <3 WBC/hpf   RBC / HPF 21-50 <3 RBC/hpf   Bacteria, UA RARE RARE   Crystals CA OXALATE CRYSTALS (A) NEGATIVE   Urine-Other MUCOUS PRESENT   I-Stat Chem 8, ED  Result Value Ref Range   Sodium 141 135 - 145 mmol/L   Potassium 3.3 (L) 3.5 - 5.1 mmol/L   Chloride 102 101 - 111 mmol/L   BUN 13 6 - 20 mg/dL   Creatinine, Ser 1.61 (H) 0.61 - 1.24 mg/dL   Glucose, Bld 95 65 - 99 mg/dL   Calcium, Ion 0.96 1.12 - 1.23 mmol/L   TCO2 24 0 - 100 mmol/L   Hemoglobin 17.3 (H) 13.0 - 17.0 g/dL    HCT 04.5 40.9 - 81.1 %  I-stat troponin, ED  Result Value Ref Range   Troponin i, poc 0.01 0.00 - 0.08 ng/mL   Comment 3           US Renal  02/21/2015   CLINICAL DATA:  Right flank pain of uncertain duration, evaluate for possible ureteral stone. , history of urinary tract stones.  EXAM: RENAL / URINARY TRACT ULTRASOUND COMPLETE  COMPARISON:  Renal ultrasound of Tahj Lindseth 24, 2016  FINDINGS: Right Kidney:  Length: 11.3 . The cortical echogenicity is normal. There is mild hydronephrosis. There is an approximately 5 mm x 7 mm x 4 mm echogenic focus in the midpole.  Left Kidney:  Length: 11.5 cm. There is no hydronephrosis. There is a 4 mm diameter upper  pole nonobstructing stone.  Bladder:  There is debris in the urinary bladder. There are ureteral jets demonstrated bilaterally.  IMPRESSION: There bilateral subcentimeter kidney stones. There is mild right-sided hydronephrosis. Bilateral ureteral jets are demonstrated.   Electronically Signed   By: David  [place] M.D.   On: 02/21/2015 09:14   Ct Renal Stone Study  03/19/2015   CLINICAL DATA:  RIGHT flank pain for 2 hours. Nausea and vomiting. History of kidney stones, hypertension.  EXAM: CT ABDOMEN AND PELVIS WITHOUT CONTRAST  TECHNIQUE: Multidetector CT imaging of the abdomen and pelvis was performed following the standard protocol without IV contrast.  COMPARISON:  CT abdomen and pelvis February 21, 2015  FINDINGS: LUNG BASES: Included view of the lung bases are clear. The visualized heart and pericardium are unremarkable.  Assessment and abdomen limited by paucity of intraperitoneal fat.  KIDNEYS/BLADDER: Kidneys are orthotopic, demonstrating normal size and morphology. Mild RIGHT hydroureteronephrosis the level of the distal ureter where a 4 mm calculus is present. Multiple punctate residual RIGHT nephrolithiasis. At least 1 punctate LEFT nephrolithiasis without obstructive uropathy. No nephrolithiasis ; limited assessment for renal masses on this nonenhanced  examination. The unopacified ureters are normal in course and caliber. Urinary bladder is partially distended and unremarkable.  SOLID ORGANS: The liver, spleen, gallbladder, pancreas and adrenal glands are unremarkable for this non-contrast examination.  GASTROINTESTINAL TRACT: The stomach, small and large bowel are normal in course and caliber without inflammatory changes, the sensitivity may be decreased by lack of enteric contrast. The appendix is not discretely identified, however there are no inflammatory changes in the right lower quadrant.  PERITONEUM/RETROPERITONEUM: Aortoiliac vessels are normal in course and caliber. No lymphadenopathy by CT size criteria. Internal reproductive organs are unremarkable. No intraperitoneal free fluid nor free air. Phleboliths in LEFT pelvis.  SOFT TISSUES/ OSSEOUS STRUCTURES: Nonsuspicious. Subcentimeter sclerotic probable bone island RIGHT iliac bone.  IMPRESSION: 4 mm distal RIGHT ureter calculus results in mild obstructive uropathy, similar. Punctate residual bilateral nephrolithiasis.   Electronically Signed   By: Awilda Metro M.D.   On: 03/19/2015 05:57   Ct Renal Stone Study  02/21/2015   CLINICAL DATA:  Right flank pain  EXAM: CT ABDOMEN AND PELVIS WITHOUT CONTRAST  TECHNIQUE: Multidetector CT imaging of the abdomen and pelvis was performed following the standard protocol without IV contrast.  COMPARISON:  11/13/2014  FINDINGS: Sagittal images of the spine are unremarkable. The lung bases are unremarkable.  Unenhanced liver, pancreas, spleen and adrenal glands are unremarkable. There is mild right hydronephrosis and right hydroureter.  Nonobstructive calcified calculus in midpole of the right kidney measures 2.7 mm.  Abdominal aorta is unremarkable. Moderate colonic stool in right colon. Moderate colonic gas transverse colon. No pericecal inflammation.  In axial image 65 there is 2.5 mm calcified calculus in distal right ureter about 2 cm from right UVJ. Left  pelvic phleboliths are stable. Moderate gas noted within rectum. No calcified calculi are noted within under distended urinary bladder.  IMPRESSION: 1. There is right nonobstructive nephrolithiasis. Mild right hydronephrosis and right hydroureter. 2. There is 2.5 mm calcified calculus in distal right ureter about 2 cm from right UVJ. 3. No small bowel obstruction. 4. No pericecal inflammation.   Electronically Signed   By: Natasha Mead M.D.   On: 02/21/2015 22:49     Will treat for kidney stone, strain all urine.  Percocet, flomax Ibuprofen and this time the patient MUST follow up with urology.  Patient verbalizes understanding and agrees to follow up  Cy Blamer, MD 03/19/15 [phone]

## 2015-03-19 NOTE — ED Notes (Signed)
Pt taken to CT scan.

## 2015-03-19 NOTE — ED Notes (Signed)
Pt. Encouraged to try and urinate for a UA one more time before an in and out would be needed. Dr. Nicanor Alcon bedside.

## 2015-03-19 NOTE — ED Notes (Signed)
Per [facility], pt here for right flank pain onset 2 hours ago. Vomiting x 1. Had kidney stones back in April and never passed them. Vomiting during triage.

## 2015-09-09 ENCOUNTER — Emergency Department (HOSPITAL_COMMUNITY)
Admission: EM | Admit: 2015-09-09 | Discharge: 2015-09-09 | Disposition: A | Discharge status: Home / Self Care | Payer: Medicaid Other

## 2015-09-10 ENCOUNTER — Encounter (HOSPITAL_COMMUNITY): Payer: Self-pay

## 2015-09-10 ENCOUNTER — Emergency Department (HOSPITAL_COMMUNITY): Payer: Medicaid Other

## 2015-09-10 DIAGNOSIS — R197 Diarrhea, unspecified: Secondary | ICD-10-CM | POA: Insufficient documentation

## 2015-09-10 DIAGNOSIS — E860 Dehydration: Secondary | ICD-10-CM | POA: Diagnosis not present

## 2015-09-10 DIAGNOSIS — Z87442 Personal history of urinary calculi: Secondary | ICD-10-CM | POA: Diagnosis not present

## 2015-09-10 DIAGNOSIS — R079 Chest pain, unspecified: Secondary | ICD-10-CM | POA: Insufficient documentation

## 2015-09-10 DIAGNOSIS — F17210 Nicotine dependence, cigarettes, uncomplicated: Secondary | ICD-10-CM | POA: Insufficient documentation

## 2015-09-10 DIAGNOSIS — I10 Essential (primary) hypertension: Secondary | ICD-10-CM | POA: Insufficient documentation

## 2015-09-10 DIAGNOSIS — R42 Dizziness and giddiness: Secondary | ICD-10-CM | POA: Diagnosis not present

## 2015-09-10 DIAGNOSIS — R112 Nausea with vomiting, unspecified: Secondary | ICD-10-CM | POA: Diagnosis present

## 2015-09-10 LAB — CBC
HCT: 48.7 % (ref 39.0–52.0)
Hemoglobin: 15.9 g/dL (ref 13.0–17.0)
MCH: 28 pg (ref 26.0–34.0)
MCHC: 32.6 g/dL (ref 30.0–36.0)
MCV: 85.9 fL (ref 78.0–100.0)
PLATELETS: 129 10*3/uL — AB (ref 150–400)
RBC: 5.67 MIL/uL (ref 4.22–5.81)
RDW: 12.8 % (ref 11.5–15.5)
WBC: 4.7 10*3/uL (ref 4.0–10.5)

## 2015-09-10 NOTE — ED Notes (Signed)
Pt here with c/o sharp central CP associated with dizziness, chills, SOB and emesis. Symptoms onset a week ago.

## 2015-09-11 ENCOUNTER — Emergency Department (HOSPITAL_COMMUNITY)
Admission: EM | Admit: 2015-09-11 | Discharge: 2015-09-11 | Disposition: A | Discharge status: Home / Self Care | Payer: Medicaid Other | Attending: Emergency Medicine | Admitting: Emergency Medicine

## 2015-09-11 DIAGNOSIS — R197 Diarrhea, unspecified: Secondary | ICD-10-CM

## 2015-09-11 DIAGNOSIS — E860 Dehydration: Secondary | ICD-10-CM

## 2015-09-11 DIAGNOSIS — R112 Nausea with vomiting, unspecified: Secondary | ICD-10-CM

## 2015-09-11 DIAGNOSIS — R42 Dizziness and giddiness: Secondary | ICD-10-CM

## 2015-09-11 LAB — BASIC METABOLIC PANEL
ANION GAP: 14 (ref 5–15)
BUN: 13 mg/dL (ref 6–20)
CALCIUM: 9.4 mg/dL (ref 8.9–10.3)
CO2: 27 mmol/L (ref 22–32)
CREATININE: 1.1 mg/dL (ref 0.61–1.24)
Chloride: 98 mmol/L — ABNORMAL LOW (ref 101–111)
GLUCOSE: 110 mg/dL — AB (ref 65–99)
Potassium: 4.1 mmol/L (ref 3.5–5.1)
Sodium: 139 mmol/L (ref 135–145)

## 2015-09-11 LAB — TROPONIN I: Troponin I: <0.03 ng/mL (ref <0.031)

## 2015-09-11 MED ORDER — MECLIZINE HCL 25 MG PO TABS
25.0000 mg | ORAL_TABLET | Freq: Once | ORAL | Status: AC
  Administered 2015-09-11: 25 mg via ORAL
  Filled 2015-09-11: qty 1

## 2015-09-11 MED ORDER — ONDANSETRON HCL 4 MG PO TABS: 4.0000 mg | ORAL_TABLET | Freq: Three times a day (TID) | ORAL | Status: DC | PRN

## 2015-09-11 MED ORDER — MECLIZINE HCL 50 MG PO TABS: 50.0000 mg | ORAL_TABLET | Freq: Four times a day (QID) | ORAL | Status: DC | PRN

## 2015-09-11 MED ORDER — SODIUM CHLORIDE 0.9 % IV BOLUS (SEPSIS)
1000.0000 mL | Freq: Once | INTRAVENOUS | Status: AC
  Administered 2015-09-11: 1000 mL via INTRAVENOUS

## 2015-09-11 MED ORDER — ONDANSETRON HCL 4 MG/2ML IJ SOLN
4.0000 mg | Freq: Four times a day (QID) | INTRAMUSCULAR | Status: DC | PRN
  Administered 2015-09-11: 4 mg via INTRAVENOUS
  Filled 2015-09-11: qty 2

## 2015-09-11 NOTE — Discharge Instructions (Signed)
Drink plenty of fluids, later this afternoon you can eat a bland diet such as toast, crackers, Jell-O, or [employer] chicken noodle soup. Avoid fried, spicy or greasy foods for the next several days. Use Diarrhea Diarrhea is watery poop (stool). It can make you feel weak, tired, thirsty, or give you a dry mouth (signs of dehydration). Watery poop is a sign of another problem, most often an infection. It often lasts 2-3 days. It can last longer if it is a sign of something serious. Take care of yourself as told by your doctor. HOME CARE   Drink 1 cup (8 ounces) of fluid each time you have watery poop.  Do not drink the following fluids:  Those that contain simple sugars (fructose, glucose, galactose, lactose, sucrose, maltose).  Sports drinks.  Fruit juices.  Whole milk products.  Sodas.  Drinks with caffeine (coffee, tea, soda) or alcohol.  Oral rehydration solution may be used if the doctor says it is okay. You may make your own solution. Follow this recipe:   - teaspoon table salt.   teaspoon baking soda.   teaspoon salt substitute containing potassium chloride.  1 tablespoons sugar.  1 liter (34 ounces) of water.  Avoid the following foods:  High fiber foods, such as raw fruits and vegetables.  Nuts, seeds, and whole grain breads and cereals.   Those that are sweetened with sugar alcohols (xylitol, sorbitol, mannitol).  Try eating the following foods:  Starchy foods, such as rice, toast, pasta, low-sugar cereal, oatmeal, baked potatoes, crackers, and bagels.  Bananas.  Applesauce.  Eat probiotic-rich foods, such as yogurt and milk products that are fermented.  Wash your hands well after each time you have watery poop.  Only take medicine as told by your doctor.  Take a warm bath to help lessen burning or pain from having watery poop. GET HELP RIGHT AWAY IF:   You cannot drink fluids without throwing up (vomiting).  You keep throwing up.  You have  blood in your poop, or your poop looks black and tarry.  You do not pee (urinate) in 6-8 hours, or there is only a small amount of very dark pee.  You have belly (abdominal) pain that gets worse or stays in the same spot (localizes).  You are weak, dizzy, confused, or light-headed.  You have a very bad headache.  Your watery poop gets worse or does not get better.  You have a fever or lasting symptoms for more than 2-3 days.  You have a fever and your symptoms suddenly get worse. MAKE SURE YOU:   Understand these instructions.  Will watch your condition.  Will get help right away if you are not doing well or get worse.   This information is not intended to replace advice given to you by your health care provider. Make sure you discuss any questions you have with your health care provider.   Document Released: 01/06/2008 Document Revised: 08/10/2014 Document Reviewed: 03/27/2012 Elsevier Interactive Patient Education 2016 [employer]  Nausea and Vomiting Nausea means you feel sick to your stomach. Throwing up (vomiting) is a reflex where stomach contents come out of your mouth. HOME CARE   Take medicine as told by your doctor.  Do not force yourself to eat. However, you do need to drink fluids.  If you feel like eating, eat a normal diet as told by your doctor.  Eat rice, wheat, potatoes, bread, lean meats, yogurt, fruits, and vegetables.  Avoid high-fat foods.  Drink enough  fluids to keep your pee (urine) clear or pale yellow.  Ask your doctor how to replace body fluid losses (rehydrate). Signs of body fluid loss (dehydration) include:  Feeling very thirsty.  Dry lips and mouth.  Feeling dizzy.  Dark pee.  Peeing less than normal.  Feeling confused.  Fast breathing or heart rate. GET HELP RIGHT AWAY IF:   You have blood in your throw up.  You have black or bloody poop (stool).  You have a bad headache or stiff neck.  You feel confused.  You have  bad belly (abdominal) pain.  You have chest pain or trouble breathing.  You do not pee at least once every 8 hours.  You have cold, clammy skin.  You keep throwing up after 24 to 48 hours.  You have a fever. MAKE SURE YOU:   Understand these instructions.  Will watch your condition.  Will get help right away if you are not doing well or get worse.   This information is not intended to replace advice given to you by your health care provider. Make sure you discuss any questions you have with your health care provider.   Document Released: 01/06/2008 Document Revised: 10/12/2011 Document Reviewed: 12/19/2010 Elsevier Interactive Patient Education 2016 [employer].  Vertigo Vertigo means that you feel like you are moving when you are not. Vertigo can also make you feel like things around you are moving when they are not. This feeling can come and go at any time. Vertigo often goes away on its own. HOME CARE  Avoid making fast movements.  Avoid driving.  Avoid using heavy machinery.  Avoid doing any task or activity that might cause danger to you or other people if you would have a vertigo attack while you are doing it.  Sit down right away if you feel dizzy or have trouble with your balance.  Take over-the-counter and prescription medicines only as told by your doctor.  Follow instructions from your doctor about which positions or movements you should avoid.  Drink enough fluid to keep your pee (urine) clear or pale yellow.  Keep all follow-up visits as told by your doctor. This is important. GET HELP IF:  Medicine does not help your vertigo.  You have a fever.  Your problems get worse or you have new symptoms.  Your family or friends see changes in your behavior.  You feel sick to your stomach (nauseous) or you throw up (vomit).  You have a "pins and needles" feeling or you are numb in part of your body. GET HELP RIGHT AWAY IF:  You have trouble moving or  talking.  You are always dizzy.  You pass out (faint).  You get very bad headaches.  You feel weak or have trouble using your hands, arms, or legs.  You have changes in your hearing.  You have changes in your seeing (vision).  You get a stiff neck.  Bright light starts to bother you.   This information is not intended to replace advice given to you by your health care provider. Make sure you discuss any questions you have with your health care provider.   Document Released: 04/28/2008 Document Revised: 04/10/2015 Document Reviewed: 11/12/2014 Elsevier Interactive Patient Education 2016 [employer].  Zofran as needed for nausea, you can take Imodium over-the-counter for diarrhea. Take the meclizine for dizziness. Recheck if you get dehydrated or seems worse.

## 2015-09-11 NOTE — ED Provider Notes (Signed)
CSN: [id number]     Arrival date & time 09/10/15  2321 History  By signing my name below, I, Soijett Blue, attest that this documentation has been prepared under the direction and in the presence of Devoria Albe, MD at 03:43. Electronically Signed: Soijett Blue, ED Scribe. 09/11/2015. 3:54 AM.   Chief Complaint  Patient presents with  . Chest Pain  . Dizziness      The history is provided by the patient. No language interpreter was used.   HPI Comments: Scott Wheeler is a 25 y.o. male who presents to the Emergency Department complaining of dizziness x spinning sensation and feeling he was  passing out onset 9 days ago. He notes that he has been feeling bad for a week. He states that his dizziness is not worsened with position change but it is there all the time. He states that he is having associated symptoms of vomiting x 10 episodes a hour, blurred vision, appetite change, and decreased urine output. He denies diarrhea or abdominal pain. Pt had a HA 2 days ago that has since resolved. He no longer remembers how he can describe the headache. He states that he has not tried any medications for the relief for his symptoms. He denies diarrhea, abdominal pain, HA, and any other symptoms. Pt is unsure of sick contacts. Pt denies eating suspicious foods at this time. Pt denies PCP or taking any daily medications. Pt notes that he smokes 1-2 cigarettes a day and heavy marijuana user. Pt is currently unemployed.  PCP none   Past Medical History  Diagnosis Date  . Bronchitis   . Hypertension   . Kidney calculi    History reviewed. No pertinent past surgical history. No family history on file. Social History  Substance Use Topics  . Smoking status: Current Every Day Smoker    Types: Cigarettes  . Smokeless tobacco: None  . Alcohol Use: Yes   unemployed Smokes 1 pack a week  Review of Systems  Constitutional: Positive for appetite change.  Eyes: Positive for visual disturbance.   Cardiovascular: Positive for chest pain.  Gastrointestinal: Positive for vomiting. Negative for abdominal pain and diarrhea.  Genitourinary: Positive for decreased urine volume.  Neurological: Positive for dizziness. Negative for headaches.  All other systems reviewed and are negative.     Allergies  Strawberry extract  Home Medications   Prior to Admission medications   Medication Sig Start Date End Date Taking? Authorizing Provider  ibuprofen (ADVIL,MOTRIN) 200 MG tablet Take 200 mg by mouth every 6 (six) hours as needed for mild pain.   Yes Historical Provider, MD  meclizine (ANTIVERT) 50 MG tablet Take 1 tablet (50 mg total) by mouth 4 (four) times daily as needed for dizziness. 09/11/15   Devoria Albe, MD  ondansetron (ZOFRAN) 4 MG tablet Take 1 tablet (4 mg total) by mouth every 8 (eight) hours as needed for nausea or vomiting. 09/11/15   Devoria Albe, MD   BP 127/77 mmHg  Pulse 65  Temp(Src) 98 F (36.7 C) (Oral)  Resp 12  Ht 6' (1.829 m)  Wt 140 lb (63.504 kg)  BMI 18.98 kg/m2  SpO2 99%  Vital signs normal   Physical Exam  Constitutional: He is oriented to person, place, and time. He appears well-developed and well-nourished.  Non-toxic appearance. He does not appear ill. No distress.  HENT:  Head: Normocephalic and atraumatic.  Right Ear: External ear normal.  Left Ear: External ear normal.  Nose: Nose normal. No mucosal  edema or rhinorrhea.  Mouth/Throat: Oropharynx is clear and moist. Mucous membranes are dry. No dental abscesses or uvula swelling.  Eyes: Conjunctivae and EOM are normal. Pupils are equal, round, and reactive to light.  Neck: Normal range of motion and full passive range of motion without pain. Neck supple.  Cardiovascular: Normal rate, regular rhythm and normal heart sounds.  Exam reveals no gallop and no friction rub.   No murmur heard. Pulmonary/Chest: Effort normal and breath sounds normal. No respiratory distress. He has no wheezes. He has no  rhonchi. He has no rales. He exhibits no tenderness and no crepitus.  Abdominal: Soft. Normal appearance and bowel sounds are normal. He exhibits no distension. There is no tenderness. There is no rebound and no guarding.  Musculoskeletal: Normal range of motion. He exhibits no edema or tenderness.  Moves all extremities well.   Neurological: He is alert and oriented to person, place, and time. He has normal strength. No cranial nerve deficit.  Skin: Skin is warm, dry and intact. No rash noted. No erythema. No pallor.  Psychiatric: He has a normal mood and affect. His speech is normal and behavior is normal. His mood appears not anxious.  Nursing note and vitals reviewed.   ED Course  Procedures (including critical care time)  Medications  ondansetron (ZOFRAN) injection 4 mg (4 mg Intravenous Given 09/11/15 0425)  sodium chloride 0.9 % bolus 1,000 mL (0 mLs Intravenous Stopped 09/11/15 0500)  sodium chloride 0.9 % bolus 1,000 mL (0 mLs Intravenous Stopped 09/11/15 0615)  meclizine (ANTIVERT) tablet 25 mg (25 mg Oral Given 09/11/15 0425)    DIAGNOSTIC STUDIES: Oxygen Saturation is 100% on RA, nl by my interpretation.    COORDINATION OF CARE: 3:54 AM Discussed treatment plan with pt at bedside which includes labs, CXR, EKG and pt agreed to plan. Patient was given IV fluids and nausea medication and also Antivert for possible vertigo.   7 AM patient is ambulatory to the bathroom without assistance. He states he's feeling better, the dizziness is improved.  Results for orders placed or performed during the hospital encounter of 09/11/15  Basic metabolic panel  Result Value Ref Range   Sodium 139 135 - 145 mmol/L   Potassium 4.1 3.5 - 5.1 mmol/L   Chloride 98 (L) 101 - 111 mmol/L   CO2 27 22 - 32 mmol/L   Glucose, Bld 110 (H) 65 - 99 mg/dL   BUN 13 6 - 20 mg/dL   Creatinine, Ser 1.61 0.61 - 1.24 mg/dL   Calcium 9.4 8.9 - 09.6 mg/dL   GFR calc non Af Amer >60 >60 mL/min   GFR calc Af Amer  >60 >60 mL/min   Anion gap 14 5 - 15  CBC  Result Value Ref Range   WBC 4.7 4.0 - 10.5 K/uL   RBC 5.67 4.22 - 5.81 MIL/uL   Hemoglobin 15.9 13.0 - 17.0 g/dL   HCT 04.5 40.9 - 81.1 %   MCV 85.9 78.0 - 100.0 fL   MCH 28.0 26.0 - 34.0 pg   MCHC 32.6 30.0 - 36.0 g/dL   RDW 91.4 78.2 - 95.6 %   Platelets 129 (L) 150 - 400 K/uL  Troponin I  Result Value Ref Range   Troponin I <0.03 <0.031 ng/mL   Laboratory interpretation all normal   Dg Chest 2 View  09/11/2015  CLINICAL DATA:  25 year old male with cough and headache EXAM: CHEST  2 VIEW COMPARISON:  Radiograph dated 10/21/2014 FINDINGS: The heart  size and mediastinal contours are within normal limits. Both lungs are clear. The visualized skeletal structures are unremarkable. IMPRESSION: No active cardiopulmonary disease. Electronically Signed   By: Elgie Collard M.D.   On: 09/11/2015 00:17    I have personally reviewed and evaluated these images and lab results as part of my medical decision-making.   EKG Interpretation   Date/Time:  Tuesday September 10 2015 23:27:01 EST Ventricular Rate:  74 PR Interval:  148 QRS Duration: 106 QT Interval:  388 QTC Calculation: 430 R Axis:   94 Text Interpretation:  Normal sinus rhythm Right atrial enlargement  Rightward axis Pulmonary disease pattern Since last tracing rate faster   21 Dec 2014) Confirmed by Lino Wickliff  MD-I, Kary Sugrue ([id number]) on 09/11/2015 2:57:28 AM      MDM   Final diagnoses:  Nausea vomiting and diarrhea  Dehydration  Dizziness    Discharge Medication List as of 09/11/2015  7:04 AM    START taking these medications   Details  meclizine (ANTIVERT) 50 MG tablet Take 1 tablet (50 mg total) by mouth 4 (four) times daily as needed for dizziness., Starting 09/11/2015, Until Discontinued, Print    ondansetron (ZOFRAN) 4 MG tablet Take 1 tablet (4 mg total) by mouth every 8 (eight) hours as needed for nausea or vomiting., Starting 09/11/2015, Until Discontinued, Print        Plan  discharge  Devoria Albe, MD, FACEP  I personally performed the services described in this documentation, which was scribed in my presence. The recorded information has been reviewed and considered.  Devoria Albe, MD, Concha Pyo, MD 09/11/15 [phone]

## 2015-09-11 NOTE — ED Notes (Addendum)
At 0107: Pt came up to nurse first stating he was going to pass out. VS rechecked by tech first, VSS. Pt with steady gait then came up to nurse first shortly after VS completed stating he needs to be seen next because he is going to pass out. Pt encouraged to please remain seated and not up walking around since he feels like he is going to pass out. Pt informed of the wait and thank for his patience. Pt stated he was going to go outside and call 911 so he can get a bed quicker. Pt told calling 911 was not necessary since he was still on [facility] property. Pt pacing outside with steady gait in no apparent distress

## 2016-05-28 ENCOUNTER — Emergency Department (HOSPITAL_COMMUNITY)
Admission: EM | Admit: 2016-05-28 | Discharge: 2016-05-28 | Disposition: A | Discharge status: Home / Self Care | Payer: Medicaid Other | Attending: Emergency Medicine | Admitting: Emergency Medicine

## 2016-05-28 ENCOUNTER — Encounter (HOSPITAL_COMMUNITY): Payer: Self-pay | Admitting: Emergency Medicine

## 2016-05-28 ENCOUNTER — Emergency Department (HOSPITAL_COMMUNITY): Payer: Medicaid Other

## 2016-05-28 DIAGNOSIS — F17210 Nicotine dependence, cigarettes, uncomplicated: Secondary | ICD-10-CM | POA: Insufficient documentation

## 2016-05-28 DIAGNOSIS — R10A0 Flank pain, unspecified side: Secondary | ICD-10-CM

## 2016-05-28 DIAGNOSIS — R109 Unspecified abdominal pain: Secondary | ICD-10-CM | POA: Insufficient documentation

## 2016-05-28 DIAGNOSIS — Z79899 Other long term (current) drug therapy: Secondary | ICD-10-CM | POA: Insufficient documentation

## 2016-05-28 LAB — URINALYSIS, ROUTINE W REFLEX MICROSCOPIC
Bilirubin Urine: NEGATIVE
GLUCOSE, UA: NEGATIVE mg/dL
Hgb urine dipstick: NEGATIVE
KETONES UR: NEGATIVE mg/dL
NITRITE: NEGATIVE
PH: 6.5 (ref 5.0–8.0)
Protein, ur: NEGATIVE mg/dL
Specific Gravity, Urine: 1.01 (ref 1.005–1.030)

## 2016-05-28 LAB — URINE MICROSCOPIC-ADD ON

## 2016-05-28 MED ORDER — IBUPROFEN 600 MG PO TABS: 600.0000 mg | ORAL_TABLET | Freq: Four times a day (QID) | ORAL | 0 refills | Status: DC | PRN

## 2016-05-28 MED ORDER — METHOCARBAMOL 750 MG PO TABS: 750.0000 mg | ORAL_TABLET | Freq: Four times a day (QID) | ORAL | 0 refills | Status: DC

## 2016-05-28 NOTE — ED Notes (Signed)
Patient is alert and oriented x3.  He was given DC instructions and follow up visit instructions.  Patient gave verbal understanding.  He was DC ambulatory under his own power to home.  V/S stable.  He was not showing any signs of distress on DC 

## 2016-05-28 NOTE — ED Provider Notes (Signed)
[facility]-EMERGENCY DEPT Provider Note   CSN: [id number] Arrival date & time: 05/28/16  0930     History   Chief Complaint Chief Complaint  Patient presents with  . Chest Pain  . Flank Pain    HPI Scott Wheeler is a 25 y.o. male.  25 year old male presents with 2 complaints the first is sharp chest pain 2 years that is worse with certain movements and not associated with dyspnea, diaphoresis, nausea vomiting. Denies any cough or congestion but does admit to use tobacco as well as use marijuana. Denies any palpitations, syncope or near-syncope.  Patient also complains of one year history of left-sided flank pain which she says is caused by kidney stones. He denies any current dysuria or hematuria. No fever or chills. Pain does not radiate to his legs. Denies any testicular pain or swelling. States his current pain has been constant for the past month. Has not used any medications to relieve his symptoms.      Past Medical History:  Diagnosis Date  . Bronchitis   . Hypertension   . Kidney calculi     There are no active problems to display for this patient.   History reviewed. No pertinent surgical history.     Home Medications    Prior to Admission medications   Medication Sig Start Date End Date Taking? Authorizing Provider  ibuprofen (ADVIL,MOTRIN) 200 MG tablet Take 200 mg by mouth every 6 (six) hours as needed for mild pain.    Historical Provider, MD  meclizine (ANTIVERT) 50 MG tablet Take 1 tablet (50 mg total) by mouth 4 (four) times daily as needed for dizziness. 09/11/15   Devoria Albe, MD  ondansetron (ZOFRAN) 4 MG tablet Take 1 tablet (4 mg total) by mouth every 8 (eight) hours as needed for nausea or vomiting. 09/11/15   Devoria Albe, MD    Family History History reviewed. No pertinent family history.  Social History Social History  Substance Use Topics  . Smoking status: Current Every Day Smoker    Types: Cigarettes  . Smokeless tobacco: Never Used  .  Alcohol use Yes     Allergies   Strawberry extract   Review of Systems Review of Systems  All other systems reviewed and are negative.    Physical Exam Updated Vital Signs BP 144/90 (BP Location: Left Arm)   Pulse (!) 55   Temp 97.9 F (36.6 C) (Oral)   Resp 16   SpO2 100%   Physical Exam  Constitutional: He is oriented to person, place, and time. He appears well-developed and well-nourished.  Non-toxic appearance. No distress.  HENT:  Head: Normocephalic and atraumatic.  Eyes: Conjunctivae, EOM and lids are normal. Pupils are equal, round, and reactive to light.  Neck: Normal range of motion. Neck supple. No tracheal deviation present. No thyroid mass present.  Cardiovascular: Normal rate, regular rhythm and normal heart sounds.  Exam reveals no gallop.   No murmur heard. Pulmonary/Chest: Effort normal and breath sounds normal. No stridor. No respiratory distress. He has no decreased breath sounds. He has no wheezes. He has no rhonchi. He has no rales.  Abdominal: Soft. Normal appearance and bowel sounds are normal. He exhibits no distension. There is no tenderness. There is no rebound and no CVA tenderness.    Musculoskeletal: Normal range of motion. He exhibits no edema or tenderness.  Neurological: He is alert and oriented to person, place, and time. He has normal strength. No cranial nerve deficit or sensory deficit. GCS  eye subscore is 4. GCS verbal subscore is 5. GCS motor subscore is 6.  Skin: Skin is warm and dry. No abrasion and no rash noted.  Psychiatric: He has a normal mood and affect. His speech is normal and behavior is normal.  Nursing note and vitals reviewed.    ED Treatments / Results  Labs (all labs ordered are listed, but only abnormal results are displayed) Labs Reviewed - No data to display  EKG  EKG Interpretation None       Radiology Dg Chest 2 View  Result Date: 05/28/2016 CLINICAL DATA:  Mid left chest pain and shortness of breath  on exertion for 1 year. EXAM: CHEST  2 VIEW COMPARISON:  PA and lateral chest 09/10/2015 and 10/21/2014. FINDINGS: The lungs are clear. Heart size is normal. No pneumothorax or pleural effusion. No focal bony abnormality. IMPRESSION: Negative chest. Electronically Signed   By: Drusilla Kannerhomas  Dalessio M.D.   On: 05/28/2016 10:43    Procedures Procedures (including critical care time)  Medications Ordered in ED Medications - No data to display   Initial Impression / Assessment and Plan / ED Course  I have reviewed the triage vital signs and the nursing notes.  Pertinent labs & imaging results that were available during my care of the patient were reviewed by me and considered in my medical decision making (see chart for details).  Clinical Course    Urinalysis and chest x-ray without acute findings. Patient stable for discharge  Final Clinical Impressions(s) / ED Diagnoses   Final diagnoses:  None    New Prescriptions New Prescriptions   No medications on file     Lorre NickAnthony Demitra Danley, MD 05/28/16 1318

## 2016-05-28 NOTE — ED Triage Notes (Signed)
Pt presents with complaints of lt sided flank pain x 2 years.  Was told in the past that he had kidney stones.  Also c/o gen chest pain x 1 year.  Was given an inhaler a year ago for "bronchitis" and lost the prescription for the inhaler.

## 2016-05-29 LAB — URINE CULTURE: CULTURE: NO GROWTH

## 2016-10-09 ENCOUNTER — Encounter (HOSPITAL_COMMUNITY): Payer: Self-pay | Admitting: Emergency Medicine

## 2016-10-09 ENCOUNTER — Emergency Department (HOSPITAL_COMMUNITY): Payer: Medicaid Other

## 2016-10-09 ENCOUNTER — Emergency Department (HOSPITAL_COMMUNITY)
Admission: EM | Admit: 2016-10-09 | Discharge: 2016-10-09 | Disposition: A | Discharge status: Home / Self Care | Payer: Medicaid Other | Attending: Emergency Medicine | Admitting: Emergency Medicine

## 2016-10-09 DIAGNOSIS — R112 Nausea with vomiting, unspecified: Secondary | ICD-10-CM

## 2016-10-09 DIAGNOSIS — F17210 Nicotine dependence, cigarettes, uncomplicated: Secondary | ICD-10-CM | POA: Insufficient documentation

## 2016-10-09 DIAGNOSIS — I10 Essential (primary) hypertension: Secondary | ICD-10-CM | POA: Diagnosis not present

## 2016-10-09 DIAGNOSIS — N200 Calculus of kidney: Secondary | ICD-10-CM | POA: Diagnosis not present

## 2016-10-09 DIAGNOSIS — Z79899 Other long term (current) drug therapy: Secondary | ICD-10-CM | POA: Diagnosis not present

## 2016-10-09 DIAGNOSIS — R1030 Lower abdominal pain, unspecified: Secondary | ICD-10-CM | POA: Diagnosis present

## 2016-10-09 LAB — BASIC METABOLIC PANEL
ANION GAP: 10 (ref 5–15)
BUN: 11 mg/dL (ref 6–20)
CALCIUM: 9.7 mg/dL (ref 8.9–10.3)
CO2: 28 mmol/L (ref 22–32)
CREATININE: 0.98 mg/dL (ref 0.61–1.24)
Chloride: 102 mmol/L (ref 101–111)
Glucose, Bld: 84 mg/dL (ref 65–99)
Potassium: 3.8 mmol/L (ref 3.5–5.1)
SODIUM: 140 mmol/L (ref 135–145)

## 2016-10-09 LAB — URINALYSIS, ROUTINE W REFLEX MICROSCOPIC
BILIRUBIN URINE: NEGATIVE
Glucose, UA: NEGATIVE mg/dL
Hgb urine dipstick: NEGATIVE
KETONES UR: 5 mg/dL — AB
LEUKOCYTES UA: NEGATIVE
NITRITE: NEGATIVE
PH: 7 (ref 5.0–8.0)
PROTEIN: NEGATIVE mg/dL
Specific Gravity, Urine: 1.016 (ref 1.005–1.030)

## 2016-10-09 LAB — CBC
HCT: 44.5 % (ref 39.0–52.0)
HEMOGLOBIN: 15 g/dL (ref 13.0–17.0)
MCH: 29 pg (ref 26.0–34.0)
MCHC: 33.7 g/dL (ref 30.0–36.0)
MCV: 86.1 fL (ref 78.0–100.0)
PLATELETS: 219 10*3/uL (ref 150–400)
RBC: 5.17 MIL/uL (ref 4.22–5.81)
RDW: 12.8 % (ref 11.5–15.5)
WBC: 6.7 10*3/uL (ref 4.0–10.5)

## 2016-10-09 MED ORDER — ONDANSETRON HCL 4 MG/2ML IJ SOLN
4.0000 mg | Freq: Once | INTRAMUSCULAR | Status: AC
Start: 2016-10-09 — End: 2016-10-09
  Administered 2016-10-09: 4 mg via INTRAVENOUS
  Filled 2016-10-09: qty 2

## 2016-10-09 MED ORDER — IBUPROFEN 800 MG PO TABS: 800.0000 mg | ORAL_TABLET | Freq: Three times a day (TID) | ORAL | 0 refills | Status: DC

## 2016-10-09 MED ORDER — HYDROMORPHONE HCL 2 MG/ML IJ SOLN
1.0000 mg | Freq: Once | INTRAMUSCULAR | Status: AC
  Administered 2016-10-09: 1 mg via INTRAVENOUS
  Filled 2016-10-09: qty 1

## 2016-10-09 MED ORDER — ONDANSETRON 4 MG PO TBDP: 4.0000 mg | ORAL_TABLET | Freq: Three times a day (TID) | ORAL | 0 refills | Status: DC | PRN

## 2016-10-09 MED ORDER — HYDROMORPHONE HCL 2 MG/ML IJ SOLN: 1.0000 mg | Freq: Once | INTRAMUSCULAR | Status: DC

## 2016-10-09 MED ORDER — ONDANSETRON HCL 4 MG/2ML IJ SOLN: 4.0000 mg | Freq: Once | INTRAMUSCULAR | Status: DC

## 2016-10-09 NOTE — ED Notes (Addendum)
Pt know we need UA sample.

## 2016-10-09 NOTE — ED Provider Notes (Signed)
[facility]-EMERGENCY DEPT Provider Note   CSN: [id number] Arrival date & time: 10/09/16  1015     History   Chief Complaint Chief Complaint  Patient presents with  . Nephrolithiasis    HPI Scott Wheeler is a 26 y.o. male.  The history is provided by the patient and medical records.    26 year old male with history of hypertension and kidney stones, presenting to the ED for flank pain. Patient reports this is bilateral in nature with some radiation to his lower abdomen. He reports this was of sudden onset this morning around 6 AM. He reports multiple episodes of nonbloody, nonbilious emesis this morning.  He denies any diarrhea. Denies fever or chills. Does report history of kidney stones in the past which have all passed spontaneously with assistance of medications. He has not had any urologic surgeries. Unsure of urinary symptoms as he did not try to urinate yet this morning. Patient is diaphoretic on arrival, appears uncomfortable.  Past Medical History:  Diagnosis Date  . Bronchitis   . Hypertension   . Kidney calculi     There are no active problems to display for this patient.   History reviewed. No pertinent surgical history.     Home Medications    Prior to Admission medications   Medication Sig Start Date End Date Taking? Authorizing Provider  ibuprofen (ADVIL,MOTRIN) 600 MG tablet Take 1 tablet (600 mg total) by mouth every 6 (six) hours as needed. 05/28/16   Lorre NickAnthony Allen, MD  meclizine (ANTIVERT) 50 MG tablet Take 1 tablet (50 mg total) by mouth 4 (four) times daily as needed for dizziness. Patient not taking: Reported on 05/28/2016 09/11/15   Devoria AlbeIva Knapp, MD  methocarbamol (ROBAXIN-750) 750 MG tablet Take 1 tablet (750 mg total) by mouth 4 (four) times daily. 05/28/16   Lorre NickAnthony Allen, MD  naproxen sodium (ANAPROX) 220 MG tablet Take 220 mg by mouth 2 (two) times daily with a meal.    Historical Provider, MD  ondansetron (ZOFRAN) 4 MG tablet Take 1 tablet (4 mg total)  by mouth every 8 (eight) hours as needed for nausea or vomiting. Patient not taking: Reported on 05/28/2016 09/11/15   Devoria AlbeIva Knapp, MD    Family History No family history on file.  Social History Social History  Substance Use Topics  . Smoking status: Current Every Day Smoker    Types: Cigarettes  . Smokeless tobacco: Never Used  . Alcohol use Yes     Allergies   Strawberry extract   Review of Systems Review of Systems  Gastrointestinal: Positive for nausea and vomiting.  Genitourinary: Positive for flank pain.  All other systems reviewed and are negative.    Physical Exam Updated Vital Signs BP 156/88 (BP Location: Left Arm)   Pulse (!) 55   Temp 97.9 F (36.6 C) (Oral)   Resp 16   Ht 6' (1.829 m)   Wt 77.1 kg   SpO2 100%   BMI 23.06 kg/m   Physical Exam  Constitutional: He is oriented to person, place, and time. He appears well-developed and well-nourished.  Diaphoretic, appears uncomfortable  HENT:  Head: Normocephalic and atraumatic.  Mouth/Throat: Oropharynx is clear and moist.  Eyes: Conjunctivae and EOM are normal. Pupils are equal, round, and reactive to light.  Neck: Normal range of motion.  Cardiovascular: Normal rate, regular rhythm and normal heart sounds.   Pulmonary/Chest: Effort normal and breath sounds normal.  Abdominal: Soft. Bowel sounds are normal. There is CVA tenderness.  Musculoskeletal: Normal  range of motion.  Neurological: He is alert and oriented to person, place, and time.  Skin: Skin is warm. He is diaphoretic.  Psychiatric: He has a normal mood and affect.  Nursing note and vitals reviewed.    ED Treatments / Results  Labs (all labs ordered are listed, but only abnormal results are displayed) Labs Reviewed  URINALYSIS, ROUTINE W REFLEX MICROSCOPIC - Abnormal; Notable for the following:       Result Value   APPearance CLOUDY (*)    Ketones, ur 5 (*)    All other components within normal limits  BASIC METABOLIC PANEL  CBC      EKG  EKG Interpretation None       Radiology Ct Renal Stone Study  Result Date: 10/09/2016 CLINICAL DATA:  Flank pain and diaphoresis. EXAM: CT ABDOMEN AND PELVIS WITHOUT CONTRAST TECHNIQUE: Multidetector CT imaging of the abdomen and pelvis was performed following the standard protocol without IV contrast. COMPARISON:  03/19/2015 FINDINGS: Lower chest:  No contributory findings. Hepatobiliary: No focal liver abnormality.No evidence of biliary obstruction or stone. Pancreas: Unremarkable. Spleen: Unremarkable. Adrenals/Urinary Tract: Negative adrenals. Punctate right nephrolithiasis. No hydronephrosis or visible ureteral calculus. Unremarkable bladder. Stomach/Bowel: Limited visualization of bowel due to paucity of fat. No obstruction. No inflammatory changes. Vascular/Lymphatic: No acute vascular abnormality. Limited visualization due to paucity of fat. No visible adenopathy. Reproductive:No pathologic findings. Other: No ascites or pneumoperitoneum. Musculoskeletal: No acute abnormalities. IMPRESSION: 1. No hydronephrosis or ureteral calculus. 2. Punctate right nephrolithiasis. Electronically Signed   By: Marnee Spring M.D.   On: 10/09/2016 11:54    Procedures Procedures (including critical care time)  Medications Ordered in ED Medications  HYDROmorphone (DILAUDID) injection 1 mg (1 mg Intravenous Given 10/09/16 1127)  ondansetron (ZOFRAN) injection 4 mg (4 mg Intravenous Given 10/09/16 1126)     Initial Impression / Assessment and Plan / ED Course  I have reviewed the triage vital signs and the nursing notes.  Pertinent labs & imaging results that were available during my care of the patient were reviewed by me and considered in my medical decision making (see chart for details).  26 year old male here with flank pain, nausea, vomiting. Does have history of kidney stones. He is afebrile but is diaphoretic and appears uncomfortable on initial exam. He has bilateral CVA tenderness. He  is actively vomiting. We'll plan for labs, IV fluids, CT renal study.  Labwork is overall reassuring. UA without any signs of infection. Renal study revealing bilateral nephrolithiasis, but no acute ureteral stone. After medications patient is feeling better. He has been able to eat and tolerate fluids here without difficulty. Will have him follow-up with urology.  Discussed plan with patient, he acknowledged understanding and agreed with plan of care.  Return precautions given for new or worsening symptoms.  Final Clinical Impressions(s) / ED Diagnoses   Final diagnoses:  Nephrolithiasis  Nausea and vomiting, intractability of vomiting not specified, unspecified vomiting type    New Prescriptions Discharge Medication List as of 10/09/2016  2:34 PM    START taking these medications   Details  ondansetron (ZOFRAN ODT) 4 MG disintegrating tablet Take 1 tablet (4 mg total) by mouth every 8 (eight) hours as needed for nausea., Starting Fri 10/09/2016, Print         Garlon Hatchet, PA-C 10/09/16 1517    Cathren Laine, MD 10/09/16 1553

## 2016-10-09 NOTE — ED Notes (Signed)
RN attempted IV 2x unsuccessfully. Will have primary RN attempt.

## 2016-10-09 NOTE — ED Notes (Addendum)
Pt girlfriend at bedside. Pt did not need anything at this time

## 2016-10-09 NOTE — ED Triage Notes (Signed)
To ED with c/o "complications from kidney stones" -- states has a hx of same, has had medicine to try to pass stone, but not working.

## 2016-10-09 NOTE — Discharge Instructions (Signed)
Take the prescribed medication as directed when needed for symptoms. Follow-up with urology-- call to make appt. Return to the ED for new or worsening symptoms.

## 2016-10-09 NOTE — ED Notes (Signed)
Patient transported to CT 

## 2016-10-13 DIAGNOSIS — R1084 Generalized abdominal pain: Secondary | ICD-10-CM | POA: Diagnosis not present

## 2016-11-06 ENCOUNTER — Encounter: Payer: Self-pay | Admitting: Family Medicine

## 2016-11-06 ENCOUNTER — Ambulatory Visit (INDEPENDENT_AMBULATORY_CARE_PROVIDER_SITE_OTHER): Payer: Medicaid Other | Admitting: Family Medicine

## 2016-11-06 VITALS — BP 126/84 | HR 70 | Temp 98.7°F | Ht 72.0 in | Wt 132.0 lb

## 2016-11-06 DIAGNOSIS — Z87442 Personal history of urinary calculi: Secondary | ICD-10-CM

## 2016-11-06 DIAGNOSIS — Z23 Encounter for immunization: Secondary | ICD-10-CM | POA: Diagnosis not present

## 2016-11-06 DIAGNOSIS — Z114 Encounter for screening for human immunodeficiency virus [HIV]: Secondary | ICD-10-CM | POA: Diagnosis not present

## 2016-11-06 MED ORDER — NAPROXEN 500 MG PO TABS: 500.0000 mg | ORAL_TABLET | Freq: Two times a day (BID) | ORAL | 1 refills | Status: DC | PRN

## 2016-11-06 NOTE — Progress Notes (Signed)
   Subjective:   Patient ID: Scott Wheeler    DOB: 10/08/1990, 26 y.o. male   MRN: [id number]  CC: to establish care with new PCP   HPI: Scott Wheeler is a 26 y.o. male who presents to clinic today for encounter to establish care.  From [place]. Present with girlfriend at visit today.  Reports he did not have a PCP prior to this but has been seen in the ED multiple  times at [facility] and [facility].   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    History of kidney stones Reports he was diagnosed with kidney stones ~2 years ago and had a CT scan.  Has been prescribed Flomax and Zofran for this in the past, along with Ibuprofen for pain.  Pain is L>R on his flank.  He does not feel as though it has helped.  Every once in a while notices blood in his urine.  Follows with [facility].   Was supposed to get an ultrasound at [facility] this AM but was cancelled due to ?insurance issue.  Today denies burning on urination, urinary frequency and urgency.  Otherwise has no other medical problems.    PMHx: kidney stones  FHx: Mom and dad also with kidney stones.   Medications: Flomax (Zofran prn for pain).  Ibuprofen as needed.  Allergies: strawberry extract    Social:  Lives with girlfriend.  Smokes 1 pack every 4-5 days.  Etoh: does not drink. No history of illicit drug use.  Sexually active with girlfriend, does not have any children. Reports 1 current partners and no history of STDs.    ROS: Denies fevers, chills, nausea, vomiting, diarrhea, abdominal pain, shortness of breath.  Medications reviewed.  Objective:   BP 126/84   Pulse 70   Temp 98.7 F (37.1 C) (Oral)   Ht 6' (1.829 m)   Wt 132 lb (59.9 kg)   BMI 17.90 kg/m  Vitals and nursing note reviewed.  General: 26 y/o male, well nourished, well developed, in no acute distress HEENT: NCAT, MMM, o/p clear  Neck: supple, non-tender without lymphadenopathy CV: RRR no MRG  Lungs: CTA B/L, no wheezes  Abdomen: soft, non-tender, +bs Skin: warm,  dry Extremities: no edema or tenderness noted   Assessment & Plan:   History of kidney stones Stable.  Patient reports taking Ibuprofen but not helping much with pain.  No change in urinary flow.  Pain L>R side.   Last CT >> 10/09/2016 with no hydronephrosis or ureteral calculus.  Punctate right hydronephrosis.  Follows with [facility].  -Instructed to stop Ibuprofen today and prescribed Naproxen 500 mg BID as needed for pain -continue follow up with urology  -Discussed return precautions   Health Maintenance -Tdap today -HIV screening   Meds ordered this encounter  Medications  . naproxen (NAPROSYN) 500 MG tablet    Sig: Take 1 tablet (500 mg total) by mouth 2 (two) times daily as needed.    Dispense:  60 tablet    Refill:  1   Follow up: 2 weeks   Freddrick March, MD [facility], PGY-1 11/13/2016 12:26 PM

## 2016-11-06 NOTE — Patient Instructions (Addendum)
It was nice meeting you today! You were seen in clinic to establish care with me as your new physician.  We also addressed your concerns of kidney stones and I have switched your Ibuprofen to another pain medication to see if that helps you.  I have sent it to your pharmacy and it will be available for you to pick up.  I expect you to start feeling better soon with this.  In addition, we discussed important health maintenance issues today and did your HIV screening and tetanus shot.  I will follow up with you with the results of the HIV screen.  Please make an appointment to follow up with [place] in 2 weeks or sooner if symptoms worsen.   Be well,  Freddrick March, MD      Dietary Guidelines to Help Prevent Kidney Stones Kidney stones are deposits of minerals and salts that form inside your kidneys. Your risk of developing kidney stones may be greater depending on your diet, your lifestyle, the medicines you take, and whether you have certain medical conditions. Most people can reduce their chances of developing kidney stones by following the instructions below. Depending on your overall health and the type of kidney stones you tend to develop, your dietitian may give you more specific instructions. What are tips for following this plan? Reading food labels   Choose foods with "no salt added" or "low-salt" labels. Limit your sodium intake to less than 1500 mg per day.  Choose foods with calcium for each meal and snack. Try to eat about 300 mg of calcium at each meal. Foods that contain 200-500 mg of calcium per serving include:  8 oz (237 ml) of milk, fortified nondairy milk, and fortified fruit juice.  8 oz (237 ml) of kefir, yogurt, and soy yogurt.  4 oz (118 ml) of tofu.  1 oz of cheese.  1 cup (300 g) of dried figs.  1 cup (91 g) of cooked broccoli.  1-3 oz can of sardines or mackerel.  Most people need 1000 to 1500 mg of calcium each day. Talk to your dietitian about how much calcium is  recommended for you. Shopping   Buy plenty of fresh fruits and vegetables. Most people do not need to avoid fruits and vegetables, even if they contain nutrients that may contribute to kidney stones.  When shopping for convenience foods, choose:  Whole pieces of fruit.  Premade salads with dressing on the side.  Low-fat fruit and yogurt smoothies.  Avoid buying frozen meals or prepared deli foods.  Look for foods with live cultures, such as yogurt and kefir. Cooking   Do not add salt to food when cooking. Place a salt shaker on the table and allow each person to add his or her own salt to taste.  Use vegetable protein, such as beans, textured vegetable protein (TVP), or tofu instead of meat in pasta, casseroles, and soups. Meal planning   Eat less salt, if told by your dietitian. To do this:  Avoid eating processed or premade food.  Avoid eating fast food.  Eat less animal protein, including cheese, meat, poultry, or fish, if told by your dietitian. To do this:  Limit the number of times you have meat, poultry, fish, or cheese each week. Eat a diet free of meat at least 2 days a week.  Eat only one serving each day of meat, poultry, fish, or seafood.  When you prepare animal protein, cut pieces into small portion sizes. For most  meat and fish, one serving is about the size of one deck of cards.  Eat at least 5 servings of fresh fruits and vegetables each day. To do this:  Keep fruits and vegetables on hand for snacks.  Eat 1 piece of fruit or a handful of berries with breakfast.  Have a salad and fruit at lunch.  Have two kinds of vegetables at dinner.  Limit foods that are high in a substance called oxalate. These include:  Spinach.  Rhubarb.  Beets.  Potato chips and french fries.  Nuts.  If you regularly take a diuretic medicine, make sure to eat at least 1-2 fruits or vegetables high in potassium each day. These include:  Avocado.  Banana.  Orange,  prune, carrot, or tomato juice.  Baked potato.  Cabbage.  Beans and split peas. General instructions   Drink enough fluid to keep your urine clear or pale yellow. This is the most important thing you can do.  Talk to your health care provider and dietitian about taking daily supplements. Depending on your health and the cause of your kidney stones, you may be advised:  Not to take supplements with vitamin C.  To take a calcium supplement.  To take a daily probiotic supplement.  To take other supplements such as magnesium, fish oil, or vitamin B6.  Take all medicines and supplements as told by your health care provider.  Limit alcohol intake to no more than 1 drink a day for nonpregnant women and 2 drinks a day for men. One drink equals 12 oz of beer, 5 oz of wine, or 1 oz of hard liquor.  Lose weight if told by your health care provider. Work with your dietitian to find strategies and an eating plan that works best for you. What foods are not recommended? Limit your intake of the following foods, or as told by your dietitian. Talk to your dietitian about specific foods you should avoid based on the type of kidney stones and your overall health. Grains  Breads. Bagels. Rolls. Baked goods. Salted crackers. Cereal. Pasta. Vegetables  Spinach. Rhubarb. Beets. Canned vegetables. Rosita Fire. Olives. Meats and other protein foods  Nuts. Nut butters. Large portions of meat, poultry, or fish. Salted or cured meats. Deli meats. Hot dogs. Sausages. Dairy  Cheese. Beverages  Regular soft drinks. Regular vegetable juice. Seasonings and other foods  Seasoning blends with salt. Salad dressings. Canned soups. Soy sauce. Ketchup. Barbecue sauce. Canned pasta sauce. Casseroles. Pizza. Lasagna. Frozen meals. Potato chips. [place] fries. Summary  You can reduce your risk of kidney stones by making changes to your diet.  The most important thing you can do is drink enough fluid. You should drink  enough fluid to keep your urine clear or pale yellow.  Ask your health care provider or dietitian how much protein from animal sources you should eat each day, and also how much salt and calcium you should have each day. This information is not intended to replace advice given to you by your health care provider. Make sure you discuss any questions you have with your health care provider. Document Released: 11/14/2010 Document Revised: 06/30/2016 Document Reviewed: 06/30/2016 Elsevier Interactive Patient Education  2017 [employer].

## 2016-11-07 LAB — HIV ANTIBODY (ROUTINE TESTING W REFLEX): HIV SCREEN 4TH GENERATION: NONREACTIVE

## 2016-11-13 DIAGNOSIS — Z87442 Personal history of urinary calculi: Secondary | ICD-10-CM | POA: Insufficient documentation

## 2016-11-13 NOTE — Assessment & Plan Note (Addendum)
Stable.  Patient reports taking Ibuprofen but not helping much with pain.  No change in urinary flow.  Pain L>R side.   Last CT >> 10/09/2016 with no hydronephrosis or ureteral calculus.  Punctate right hydronephrosis.  Follows with [facility].  -Instructed to stop Ibuprofen today and prescribed Naproxen 500 mg BID as needed for pain -continue follow up with urology  -Discussed return precautions

## 2016-12-10 ENCOUNTER — Encounter: Payer: Self-pay | Admitting: Family Medicine

## 2016-12-10 ENCOUNTER — Ambulatory Visit (INDEPENDENT_AMBULATORY_CARE_PROVIDER_SITE_OTHER): Payer: Medicaid Other | Admitting: Family Medicine

## 2016-12-10 ENCOUNTER — Other Ambulatory Visit (HOSPITAL_COMMUNITY)
Admission: RE | Admit: 2016-12-10 | Discharge: 2016-12-10 | Disposition: A | Discharge status: Home / Self Care | Payer: Medicaid Other | Source: Ambulatory Visit | Attending: Family Medicine | Admitting: Family Medicine

## 2016-12-10 VITALS — BP 110/70 | HR 77 | Temp 98.7°F | Wt 135.0 lb

## 2016-12-10 DIAGNOSIS — Z202 Contact with and (suspected) exposure to infections with a predominantly sexual mode of transmission: Secondary | ICD-10-CM

## 2016-12-10 MED ORDER — METRONIDAZOLE 500 MG PO TABS: 2000.0000 mg | ORAL_TABLET | Freq: Once | ORAL | Status: DC

## 2016-12-10 NOTE — Patient Instructions (Signed)
I have treated you for presumed Trichomonas today w/ Metronidazole 2000mg .  I am testing you for Trichomonas, Gonorrhea and Chlamydia today.  I will call you with these results. Trichomonas Test Why am I having this test? The trichomonas test is done to diagnose trichomoniasis, an infection caused by an organism called Trichomonas. Trichomoniasis is a sexually transmitted infection (STI). In women, it causes vaginal infections. In men, it can cause the tube that carries urine (urethra) to become inflamed (urethritis). You may have this test as a part of a routine screening for STIs or if you have symptoms of trichomoniasis. To perform the test, your health care provider will take a sample of discharge. The sample is taken from the vagina or cervix in women and from the urethra in men. A urine sample can also be used for testing. What do the results mean? It is your responsibility to obtain your test results. Ask the lab or department performing the test when and how you will get your results. Contact your health care provider to discuss any questions you have about your results. Negative test results  A negative test means you do not have trichomoniasis. Follow your health care provider's directions about any follow-up testing. Positive test results  A positive test result means you have an active infection that needs to be treated with antibiotic medicine. All your current sexual partners must also be treated or it is likely you will get reinfected. If your test is positive, your health care provider will start you on medicine and may advise you to:  Not have sexual intercourse until your infection has cleared up.  Use a latex condom properly every time you have sexual intercourse.  Limit the number of sexual partners you have. The more partners you have, the greater your risk of contracting trichomoniasis or another STI.  Tell all sexual partners about your infection so they can also be treated  and to prevent reinfection. Talk with your health care provider to discuss your results, treatment options, and if necessary, the need for more tests. Talk with your health care provider if you have any questions about your results. This information is not intended to replace advice given to you by your health care provider. Make sure you discuss any questions you have with your health care provider. Document Released: 08/22/2004 Document Revised: 02/19/2016 Document Reviewed: 08/01/2013 Elsevier Interactive Patient Education  2017 [employer]

## 2016-12-10 NOTE — Progress Notes (Signed)
   Subjective: CC: exposure to sti WUJ:WJXBHPI:Scott Wheeler is a 26 y.o. male presenting to clinic today for same day appointment. PCP: Freddrick MarchAmin, Yashika, MD Concerns today include:  1. Exposure to STI Patient reports that girlfriend recently diagnosed with Trichomonas.  She is currently on treatment and he presents for evaluation.  He denies penile discharge or pain.  No testicular pain.  No GU lesions.  No dysuria, hematuria, abdominal pain, nausea, vomiting, fevers.  He reports a kidney stone for which he is seeing [facility].  Allergies  Allergen Reactions  . Strawberry Extract Swelling   Social Hx reviewed. MedHx, current medications and allergies reviewed.  Please see EMR. ROS: Per HPI  Objective: Office vital signs reviewed. BP 110/70   Pulse 77   Temp 98.7 F (37.1 C) (Oral)   Wt 135 lb (61.2 kg)   SpO2 99%   BMI 18.31 kg/m   Physical Examination:  General: Awake, alert, thin, No acute distress HEENT: Normal, MMM, sclera white GU: normal male, no lesions/ ulcerations appreciated, no penile discharge. No testicular masses or pain.  Lymph: no enlarged inguinal lymph nodes  Assessment/ Plan: 26 y.o. male   1. Exposure to trichomonas.  Will check for trich/ GC/CT.  Appears that he just had HIV testing a couple of weeks ago so this not repeated.  Empiric treatment for trichomonas administered today.  Side effects reviewed. - Urine cytology ancillary only - metroNIDAZOLE (FLAGYL) tablet 2,000 mg; Take 4 tablets (2,000 mg total) by mouth once. - Follow up with PCP prn.  Raliegh IpAshly M Gottschalk, DO PGY-3, [facility]

## 2016-12-11 LAB — URINE CYTOLOGY ANCILLARY ONLY
CHLAMYDIA, DNA PROBE: NEGATIVE
NEISSERIA GONORRHEA: NEGATIVE
Trichomonas: POSITIVE — AB

## 2016-12-13 ENCOUNTER — Encounter: Payer: Self-pay | Admitting: Family Medicine

## 2017-01-28 ENCOUNTER — Other Ambulatory Visit: Payer: Self-pay | Admitting: Family Medicine

## 2017-01-28 NOTE — Telephone Encounter (Signed)
Pt's girlfriend states they have passed Trich back and forth. Girlfriend was given a week's worth of medication and would like pt to get the same so hopefully this ends the cycle. Pt uses [facility] on [address]. ep

## 2017-01-29 NOTE — Telephone Encounter (Signed)
2nd request. ep °

## 2017-01-30 NOTE — Telephone Encounter (Signed)
Have called in Metronidazole to pt's pharmacy -- Please inform him to pick it up from  [facility] [address] and take as prescribed over next 7 days.  Can call clinic with any questions.

## 2017-02-02 NOTE — Telephone Encounter (Signed)
Patient and girlfriend informed.

## 2017-02-19 ENCOUNTER — Encounter: Payer: Self-pay | Admitting: Student in an Organized Health Care Education/Training Program

## 2017-02-19 ENCOUNTER — Other Ambulatory Visit (HOSPITAL_COMMUNITY)
Admission: RE | Admit: 2017-02-19 | Discharge: 2017-02-19 | Disposition: A | Discharge status: Home / Self Care | Payer: Medicaid Other | Source: Ambulatory Visit | Attending: Family Medicine | Admitting: Family Medicine

## 2017-02-19 ENCOUNTER — Ambulatory Visit (INDEPENDENT_AMBULATORY_CARE_PROVIDER_SITE_OTHER): Payer: Medicaid Other | Admitting: Student in an Organized Health Care Education/Training Program

## 2017-02-19 VITALS — BP 140/90 | HR 68 | Temp 98.1°F | Ht 71.0 in | Wt 130.8 lb

## 2017-02-19 DIAGNOSIS — Z202 Contact with and (suspected) exposure to infections with a predominantly sexual mode of transmission: Secondary | ICD-10-CM | POA: Diagnosis not present

## 2017-02-19 NOTE — Patient Instructions (Signed)
It was a pleasure seeing you today in our clinic. Today we discussed trichamonas. Here is the treatment plan we have discussed and agreed upon together:  - I will call you with the results of this test. They will likely be available on Monday.  Our clinic's number is [phone]. Please call with questions or concerns about what we discussed today.  Be well, Dr. Mosetta PuttFeng

## 2017-02-19 NOTE — Progress Notes (Signed)
   CC: trichomonas follow up  HPI: Scott Wheeler is a 26 y.o. male with PMH significant for kidney stones and exposure to trichomonas  History of + trichomonas test Patient was recently seen in the clinic on 12/10/2016 for exposure to STI (girlfriend was diagnosed with trichomonas), and he was found to have +trich and was treated with metronidazole at that time. Per clinic note from that office visit he denied penile discharge or pain, testicular pain, GU lesions, dysuria hematuria, abdominal pain, N/V or fevers at that time.  He continues to deny these symptoms at this point.  Because he was asymptomatic with previous positive trichomonas test, today he requests follow up for test of cure.  Review of Symptoms:  See HPI for ROS.   CC, SH/smoking status, and VS noted.  Objective: BP 140/90 (BP Location: Left Arm, Patient Position: Sitting, Cuff Size: Normal)   Pulse 68   Temp 98.1 F (36.7 C) (Oral)   Ht 5\' 11"  (1.803 m)   Wt 130 lb 12.8 oz (59.3 kg)   SpO2 99%   BMI 18.24 kg/m  GEN: NAD, alert, cooperative, and pleasant. GI: soft, non-tender, non-distended, no hepatosplenomegaly SKIN: warm and dry, no rashes or lesions NEURO: II-XII grossly intact, normal gait, peripheral sensation intact PSYCH: AAOx3, appropriate affect  Assessment and plan:  Exposure to STD - test of cure for trichomonas today - will follow up with the results when they are available - safe sex counseling provided - return precautions provided  Howard PouchLauren Maddyx Vallie, MD,MS,  PGY2 02/21/2017 12:33 PM

## 2017-02-21 DIAGNOSIS — Z202 Contact with and (suspected) exposure to infections with a predominantly sexual mode of transmission: Secondary | ICD-10-CM | POA: Insufficient documentation

## 2017-02-21 NOTE — Assessment & Plan Note (Signed)
-   test of cure for trichomonas today - will follow up with the results when they are available - safe sex counseling provided - return precautions provided

## 2017-02-22 ENCOUNTER — Telehealth: Payer: Self-pay | Admitting: Student in an Organized Health Care Education/Training Program

## 2017-02-22 LAB — URINE CYTOLOGY ANCILLARY ONLY
CHLAMYDIA, DNA PROBE: NEGATIVE
NEISSERIA GONORRHEA: NEGATIVE
Trichomonas: NEGATIVE

## 2017-02-22 NOTE — Telephone Encounter (Signed)
Called and informed patient of negative GC/Chla and trich results. He voiced understanding. Howard PouchLauren Rayfield Beem, MD PGY-2 [facility]

## 2017-06-27 DIAGNOSIS — J039 Acute tonsillitis, unspecified: Secondary | ICD-10-CM | POA: Diagnosis not present

## 2017-07-06 ENCOUNTER — Ambulatory Visit: Payer: Medicaid Other | Admitting: Internal Medicine

## 2017-07-06 ENCOUNTER — Other Ambulatory Visit: Payer: Self-pay

## 2017-07-06 ENCOUNTER — Encounter: Payer: Self-pay | Admitting: Internal Medicine

## 2017-07-06 DIAGNOSIS — J029 Acute pharyngitis, unspecified: Secondary | ICD-10-CM

## 2017-07-06 NOTE — Progress Notes (Signed)
   Subjective:   Patient: Scott Wheeler       Birthdate: Mar 31, 1991       MRN: [id number]      HPI  Scott Wheeler is a 26 y.o. male presenting for same day appt for sore throat.   Sore throat Ongoing for about 1.5 weeks. [facility]. Rapid strep and flu were neg at that time. Patient was prescribed PCN and prednisone. He says that his symptoms completely resolved after finishing these medications, however he developed another sore throat a few days ago. Says he has pain with swallowing and that pain "goes to his head." Denies fevers but does endorse chills. Endorses decreased appetite. Has been taking Nyquil to help him sleep at night. His partner says he has been snoring louder. Endorses nasal congestion. Denies coughing.   Smoking status reviewed. Patient is current every day smoker.   Review of Systems See HPI.     Objective:  Physical Exam  Constitutional: He is oriented to person, place, and time and well-developed, well-nourished, and in no distress.  HENT:  Head: Normocephalic and atraumatic.  Nose: Nose normal.  Oropharyngeal erythema, no exudates. MMM. Uvula midline.   Eyes: Conjunctivae and EOM are normal. Pupils are equal, round, and reactive to light. Right eye exhibits no discharge. Left eye exhibits no discharge.  Neck: Normal range of motion. Neck supple.  Pulmonary/Chest: Effort normal. No respiratory distress.  Lymphadenopathy:    He has no cervical adenopathy.  Neurological: He is alert and oriented to person, place, and time.  Skin: Skin is warm and dry.  Psychiatric: Affect normal.   Assessment & Plan:  Sore throat Likely viral. Recently neg rapid strep and flu. Recently completed course of PCN and prednisone. Less concern for peritonsillar abscess, as no tonsillar hypertrophy or masses, no muffled voice, no neck masses or pain, no fevers. Less concern for strep also as no fevers and no lymphadenopathy. Symptoms not consistent with flu as  patient only with sore throat and mild nasal congestion. Accompanying nasal congestion makes viral etiology more likely. Afebrile and non-toxic in appearance in office today. Will treat conservatively with ibuprofen/Tylenol and Cepachol/Chloraseptic. Do not feel that antibiotics or steroids are indicated at this time. If no better by Friday, patient to return. Can consider repeat strep at that time if necessary.    Tarri AbernethyAbigail J Nakeesha Bowler, MD, MPH PGY-3 [facility] Pager [phone]

## 2017-07-06 NOTE — Patient Instructions (Signed)
It was nice meeting you today Scott Wheeler!  For your sore throat, you can take ibuprofen or Tylenol up to every 6 hours as needed for pain. You can also try Chloraseptic throat spray since the Cepachol throat lozenges weren't helpful. Make sure you are drinking a lot of water as well.   If you are not getting better by about Friday, please call to let us know.   If you have any questions or concerns, please feel free to call the clinic.   Be well,  Dr. Natale MilchLancaster

## 2017-07-06 NOTE — Assessment & Plan Note (Signed)
Likely viral. Recently neg rapid strep and flu. Recently completed course of PCN and prednisone. Less concern for peritonsillar abscess, as no tonsillar hypertrophy or masses, no muffled voice, no neck masses or pain, no fevers. Less concern for strep also as no fevers and no lymphadenopathy. Symptoms not consistent with flu as patient only with sore throat and mild nasal congestion. Accompanying nasal congestion makes viral etiology more likely. Afebrile and non-toxic in appearance in office today. Will treat conservatively with ibuprofen/Tylenol and Cepachol/Chloraseptic. Do not feel that antibiotics or steroids are indicated at this time. If no better by Friday, patient to return. Can consider repeat strep at that time if necessary.

## 2017-12-16 ENCOUNTER — Other Ambulatory Visit (HOSPITAL_COMMUNITY)
Admission: RE | Admit: 2017-12-16 | Discharge: 2017-12-16 | Disposition: A | Discharge status: Home / Self Care | Payer: Medicaid Other | Source: Ambulatory Visit | Attending: Family Medicine | Admitting: Family Medicine

## 2017-12-16 ENCOUNTER — Ambulatory Visit: Payer: Medicaid Other | Admitting: Family Medicine

## 2017-12-16 ENCOUNTER — Other Ambulatory Visit: Payer: Self-pay

## 2017-12-16 ENCOUNTER — Encounter: Payer: Self-pay | Admitting: Family Medicine

## 2017-12-16 VITALS — BP 100/63 | HR 61 | Temp 97.7°F | Wt 140.0 lb

## 2017-12-16 DIAGNOSIS — A5909 Other urogenital trichomoniasis: Secondary | ICD-10-CM | POA: Diagnosis not present

## 2017-12-16 DIAGNOSIS — Z202 Contact with and (suspected) exposure to infections with a predominantly sexual mode of transmission: Secondary | ICD-10-CM

## 2017-12-16 MED ORDER — METRONIDAZOLE 500 MG PO TABS: 500.0000 mg | ORAL_TABLET | Freq: Three times a day (TID) | ORAL | 0 refills | Status: DC

## 2017-12-16 NOTE — Assessment & Plan Note (Signed)
Metronidazole  BID for 7 days if urine comes back positive for Trich.   Will call patient when results are in.

## 2017-12-16 NOTE — Progress Notes (Signed)
     Subjective: Chief Complaint  Patient presents with  . Exposure to STD     HPI: Scott Wheeler is a 27 y.o. presenting to clinic today to discuss the following:  Exposure to Trichomoniasis Patient states his girlfriend tested positive for Trichomoniasis and he is here to get tested. He denies any symptoms. He has no urinary urgency, increase in frequency, or pain when he urinates. He has no discharge or painful lesions. He requested the full week of Flagyl instead of the one time dose.  Health Maintenance: none     ROS noted in HPI.   Past Medical, Surgical, Social, and Family History Reviewed & Updated per EMR.   Pertinent Historical Findings include:   Social History   Tobacco Use  Smoking Status Current Every Day Smoker  . Types: Cigarettes  Smokeless Tobacco Never Used      Objective: BP 100/63   Pulse 61   Temp 97.7 F (36.5 C) (Oral)   Wt 140 lb (63.5 kg)   SpO2 99%   BMI 19.53 kg/m  Vitals and nursing notes reviewed  Physical Exam  Constitutional: He is oriented to person, place, and time. He appears well-developed and well-nourished. No distress.  HENT:  Head: Normocephalic and atraumatic.  Eyes: Pupils are equal, round, and reactive to light. Conjunctivae and EOM are normal.  Neck: Normal range of motion.  Cardiovascular: Normal rate, regular rhythm, normal heart sounds and intact distal pulses.  No murmur heard. Pulmonary/Chest: Effort normal and breath sounds normal. He has no wheezes. He has no rales.  Genitourinary:  Genitourinary Comments: Patient declined   Musculoskeletal: Normal range of motion. He exhibits no edema.  Neurological: He is alert and oriented to person, place, and time.  Skin: Skin is warm and dry. No rash noted. No erythema.   No results found for this or any previous visit (from the past 72 hour(s)).  Assessment/Plan:  Exposure to STD Metronidazole  BID for 7 days if urine comes back positive for Trich.    Will call patient when results are in.    PATIENT EDUCATION PROVIDED: See AVS    Diagnosis and plan along with any newly prescribed medication(s) were discussed in detail with this patient today. The patient verbalized understanding and agreed with the plan. Patient advised if symptoms worsen return to clinic or ER.   Health Maintainance:   No orders of the defined types were placed in this encounter.   Meds ordered this encounter  Medications  . metroNIDAZOLE (FLAGYL) 500 MG tablet    Sig: Take 1 tablet (500 mg total) by mouth 3 (three) times daily.    Dispense:  21 tablet    Refill:  0     Jules Schick, DO 12/16/2017, 11:32 AM PGY-1, [facility]

## 2017-12-16 NOTE — Patient Instructions (Signed)
It was great to meet you today! Thank you for letting me participate in your care!  Today, we discussed your possible recent exposure to Trichomonasis. I am sending a medication called Metronidazole to the pharmacy. Please take it as prescribed ONLY if I call you with positive test results. I will call you when I have the test results available.  Be well, Jules Schick, DO PGY-1, [facility]

## 2017-12-17 ENCOUNTER — Other Ambulatory Visit: Payer: Self-pay | Admitting: Family Medicine

## 2017-12-17 LAB — URINE CYTOLOGY ANCILLARY ONLY
CHLAMYDIA, DNA PROBE: NEGATIVE
NEISSERIA GONORRHEA: NEGATIVE
Trichomonas: POSITIVE — AB

## 2017-12-17 NOTE — Progress Notes (Signed)
Patient tested positive for Trichimonas. Called patient and informed him to pick up his medication.

## 2017-12-20 ENCOUNTER — Telehealth: Payer: Self-pay | Admitting: Family Medicine

## 2017-12-20 ENCOUNTER — Other Ambulatory Visit: Payer: Self-pay | Admitting: Family Medicine

## 2017-12-20 MED ORDER — METRONIDAZOLE 500 MG PO TABS: 2000.0000 mg | ORAL_TABLET | Freq: Two times a day (BID) | ORAL | 0 refills | Status: DC

## 2017-12-20 NOTE — Telephone Encounter (Signed)
Called patient regarding positive trichomonas result.  He had already been notified via VM and is taking metronidazole as prescribed.  Is aware that all partners need to be tested.  He has an appointment to be seen after he completes treatment.

## 2018-01-03 ENCOUNTER — Other Ambulatory Visit (HOSPITAL_COMMUNITY)
Admission: RE | Admit: 2018-01-03 | Discharge: 2018-01-03 | Disposition: A | Discharge status: Home / Self Care | Payer: Medicaid Other | Source: Ambulatory Visit | Attending: Family Medicine | Admitting: Family Medicine

## 2018-01-03 ENCOUNTER — Other Ambulatory Visit: Payer: Self-pay

## 2018-01-03 ENCOUNTER — Ambulatory Visit (INDEPENDENT_AMBULATORY_CARE_PROVIDER_SITE_OTHER): Payer: Medicaid Other | Admitting: Family Medicine

## 2018-01-03 ENCOUNTER — Encounter: Payer: Self-pay | Admitting: Family Medicine

## 2018-01-03 VITALS — BP 126/72 | HR 49 | Temp 97.9°F | Ht 71.0 in | Wt 139.0 lb

## 2018-01-03 DIAGNOSIS — Z202 Contact with and (suspected) exposure to infections with a predominantly sexual mode of transmission: Secondary | ICD-10-CM | POA: Diagnosis present

## 2018-01-03 DIAGNOSIS — K644 Residual hemorrhoidal skin tags: Secondary | ICD-10-CM

## 2018-01-03 NOTE — Progress Notes (Signed)
   Subjective:   Patient ID: Scott Wheeler L Auerbach    DOB: [birth year], 27 y.o. male   MRN: [id number]  CC: f/u exposure trichomonas, hemorrhoids   HPI: Scott Wheeler is a 27 y.o. male who presents to clinic today for the following issues.  Exposure to trichomonas  Patient seen on 5/16 after trichomonas exposure.  He reports his gf tested positive for trichomonas and his test was positive as well. He denies any painful lesions, no urinary symptoms.  Was prescribed a course of flagyl which he has now completed.  He presents today for test of cure.    Hemorrhoids  Patient reports a few months ago he had felt as though he has hemorrhoids. Used some topical hydrocortisone which relieved the pain and it resolved.  Over the last 2-3 days discomfort has returned.  He states his bottom feels swollen after sitting for prolonged periods of time and when walking around.  He has not had any changes in eating habits or bowel movements.   He sometimes notes blood on his toilet tissue but no blood in the toilet bowl.  He has about 1-2 bowel movements a day and his stool is not hard.  Denies having to strain, no pain with bowel movements.  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  He is sexually active with females only.  No history of anal intercourse.   ROS: No fever, chills, nausea, vomiting.  No blood in stool, diarrhea or constipation.    Social: pt is a current everyday smoker. Medications reviewed. Objective:   BP 126/72   Pulse (!) 49   Temp 97.9 F (36.6 C) (Oral)   Ht 5\' 11"  (1.803 m)   Wt 139 lb (63 kg)   SpO2 98%   BMI 19.39 kg/m  Vitals and nursing note reviewed.  General: 27 yo AA male, well appearing, NAD  CV: RRR no MRG  Lungs: CTAB, normal effort  Abdomen: soft, NTND, +bs  Genitourinary: declined Rectal exam: two external hemorrhoids noted, nontender on palpation, no visible blood or anal fissure noted Skin: warm, dry, no rash Extremities: warm and well  perfused, normal tone  Assessment & Plan:   Exposure to STD Completed course of flagyl. Asymptomatic, anticipate infection has resolved. Repeat urine test today.  -Will inform pt once results back.   External hemorrhoid External hemorrhoids noted on physical exam, nonthrombosed, without bleeding.  Do not suspect anal fissure or other cause.  No history of constipation or having to strain.   -Discussed increasing fibre in diet and water intake -Can try topical or suppository OTC Anusol  -f/u if symptoms worsen or do not improve    Scott MarchYashika Jeanae Whitmill, MD [facility], PGY-2 01/03/2018 4:11 PM

## 2018-01-03 NOTE — Patient Instructions (Addendum)
It was nice seeing you again today! You were seen in clinic for a follow up urine test and for hemorrhoids. I will call you once I have the result of your urine test.    For the hemorrhoids, I would suggest trying topical Anusol (or suppository form), in addition to increasing your water and fiber intake.  You can try taking Metamucil mixed with water.  I am also including some additional information below that may help you.   If your symptoms worsen or do not improve, please come in to be seen again.     Please call clinic if you have any questions.   Freddrick March MD    Hemorrhoids Hemorrhoids are swollen veins in and around the rectum or anus. There are two types of hemorrhoids:  Internal hemorrhoids. These occur in the veins that are just inside the rectum. They may poke through to the outside and become irritated and painful.  External hemorrhoids. These occur in the veins that are outside of the anus and can be felt as a painful swelling or hard lump near the anus.  Most hemorrhoids do not cause serious problems, and they can be managed with home treatments such as diet and lifestyle changes. If home treatments do not help your symptoms, procedures can be done to shrink or remove the hemorrhoids. What are the causes? This condition is caused by increased pressure in the anal area. This pressure may result from various things, including:  Constipation.  Straining to have a bowel movement.  Diarrhea.  Pregnancy.  Obesity.  Sitting for long periods of time.  Heavy lifting or other activity that causes you to strain.  Anal sex.  What are the signs or symptoms? Symptoms of this condition include:  Pain.  Anal itching or irritation.  Rectal bleeding.  Leakage of stool (feces).  Anal swelling.  One or more lumps around the anus.  How is this diagnosed? This condition can often be diagnosed through a visual exam. Other exams or tests may also be done, such  as:  Examination of the rectal area with a gloved hand (digital rectal exam).  Examination of the anal canal using a small tube (anoscope).  A blood test, if you have lost a significant amount of blood.  A test to look inside the colon (sigmoidoscopy or colonoscopy).  How is this treated? This condition can usually be treated at home. However, various procedures may be done if dietary changes, lifestyle changes, and other home treatments do not help your symptoms. These procedures can help make the hemorrhoids smaller or remove them completely. Some of these procedures involve surgery, and others do not. Common procedures include:  Rubber band ligation. Rubber bands are placed at the base of the hemorrhoids to cut off the blood supply to them.  Sclerotherapy. Medicine is injected into the hemorrhoids to shrink them.  Infrared coagulation. A type of light energy is used to get rid of the hemorrhoids.  Hemorrhoidectomy surgery. The hemorrhoids are surgically removed, and the veins that supply them are tied off.  Stapled hemorrhoidopexy surgery. A circular stapling device is used to remove the hemorrhoids and use staples to cut off the blood supply to them.  Follow these instructions at home: Eating and drinking  Eat foods that have a lot of fiber in them, such as whole grains, beans, nuts, fruits, and vegetables. Ask your health care provider about taking products that have added fiber (fiber supplements).  Drink enough fluid to keep your urine  clear or pale yellow. Managing pain and swelling  Take warm sitz baths for 20 minutes, 3-4 times a day to ease pain and discomfort.  If directed, apply ice to the affected area. Using ice packs between sitz baths may be helpful. ? Put ice in a plastic bag. ? Place a towel between your skin and the bag. ? Leave the ice on for 20 minutes, 2-3 times a day. General instructions  Take over-the-counter and prescription medicines only as told by  your health care provider.  Use medicated creams or suppositories as told.  Exercise regularly.  Go to the bathroom when you have the urge to have a bowel movement. Do not wait.  Avoid straining to have bowel movements.  Keep the anal area dry and clean. Use wet toilet paper or moist towelettes after a bowel movement.  Do not sit on the toilet for long periods of time. This increases blood pooling and pain. Contact a health care provider if:  You have increasing pain and swelling that are not controlled by treatment or medicine.  You have uncontrolled bleeding.  You have difficulty having a bowel movement, or you are unable to have a bowel movement.  You have pain or inflammation outside the area of the hemorrhoids. This information is not intended to replace advice given to you by your health care provider. Make sure you discuss any questions you have with your health care provider. Document Released: 07/17/2000 Document Revised: 12/18/2015 Document Reviewed: 04/03/2015 Elsevier Interactive Patient Education  [employer]2018 [employer]

## 2018-01-03 NOTE — Assessment & Plan Note (Signed)
External hemorrhoids noted on physical exam, nonthrombosed, without bleeding.  Do not suspect anal fissure or other cause.  No history of constipation or having to strain.   -Discussed increasing fibre in diet and water intake -Can try topical or suppository OTC Anusol  -f/u if symptoms worsen or do not improve

## 2018-01-03 NOTE — Assessment & Plan Note (Signed)
Completed course of flagyl. Asymptomatic, anticipate infection has resolved. Repeat urine test today.  -Will inform pt once results back.

## 2018-01-04 LAB — URINE CYTOLOGY ANCILLARY ONLY
Chlamydia: NEGATIVE
Neisseria Gonorrhea: NEGATIVE
TRICH (WINDOWPATH): NEGATIVE

## 2018-04-27 ENCOUNTER — Ambulatory Visit (INDEPENDENT_AMBULATORY_CARE_PROVIDER_SITE_OTHER): Payer: Medicaid Other | Admitting: Family Medicine

## 2018-04-27 ENCOUNTER — Other Ambulatory Visit (HOSPITAL_COMMUNITY)
Admission: RE | Admit: 2018-04-27 | Discharge: 2018-04-27 | Disposition: A | Discharge status: Home / Self Care | Payer: Medicaid Other | Source: Ambulatory Visit | Attending: Family Medicine | Admitting: Family Medicine

## 2018-04-27 ENCOUNTER — Encounter: Payer: Self-pay | Admitting: Family Medicine

## 2018-04-27 ENCOUNTER — Other Ambulatory Visit: Payer: Self-pay

## 2018-04-27 VITALS — BP 118/80 | HR 80 | Temp 98.2°F | Wt 138.0 lb

## 2018-04-27 DIAGNOSIS — M545 Low back pain, unspecified: Secondary | ICD-10-CM

## 2018-04-27 DIAGNOSIS — Z831 Family history of other infectious and parasitic diseases: Secondary | ICD-10-CM | POA: Diagnosis not present

## 2018-04-27 DIAGNOSIS — G8929 Other chronic pain: Secondary | ICD-10-CM

## 2018-04-27 DIAGNOSIS — Z202 Contact with and (suspected) exposure to infections with a predominantly sexual mode of transmission: Secondary | ICD-10-CM | POA: Diagnosis not present

## 2018-04-27 DIAGNOSIS — Z8619 Personal history of other infectious and parasitic diseases: Secondary | ICD-10-CM | POA: Diagnosis not present

## 2018-04-27 NOTE — Progress Notes (Signed)
    Subjective:    Patient ID: Scott Wheeler, male    DOB: [birth year], 27 y.o.   MRN: [id number]    CC: STD check, Low back pain  Low back pain- chronic for past 6-8 years. Worse at night, hard to sleep. Wakes up and walks around with improvement in pain. Has tried ibuprofen and tylenol with minimal relief. Denies any inciting injury or trauma to back but works a job lifting heavy things and feels that has contributed. He had an x-ray of his back 3 years ago and he can't remember what they told him about these imaging results but he thinks there was something wrong with his back in that x-ray. He is tired of having constant back ache.   STD check- wants to ensure he does not have trichomonas. Tested positive, was treated adequately and tested again with negative results. Wants to be checked again for trich. Denies symptoms.  Smoking status reviewed- current every day smoker  Review of Systems- no LE weakness, no bladder or bowel incontinence, no numbness/tingling    Objective:  BP 118/80   Pulse 80   Temp 98.2 F (36.8 C) (Oral)   Wt 138 lb (62.6 kg)   SpO2 98%   BMI 19.25 kg/m  Vitals and nursing note reviewed  General: well nourished, in no acute distress HEENT: normocephalic, MMM Cardiac: RRR, clear S1 and S2, no murmurs, rubs, or gallops Respiratory: clear to auscultation bilaterally, no increased work of breathing Back: normal spinal curvature. No tenderness to palpation of midline spine. Tender to palpation of lumbar paraspinal musculature.  Extremities: no edema or cyanosis.  Skin: warm and dry, no rashes noted Neuro: alert and oriented, no focal deficits   Assessment & Plan:    Chronic midline low back pain without sciatica  Had a long discussion with patient. Reviewed imaging from 2016 without acute cause for pain and no degenerative changes. On that study the history was given as 3 years of back pain bringing his total years of pain to 6. Discussed these results  with patient. Pain is muscular in nature. Discussed back rehab exercises, gave patient handout with instructions to do these daily. Continue tylenol and ibuprofen as needed for pain. Can add heating pad at night time for relief and help sleeping. Asked him to try OTC topical muscle pain relief creams. Would not give any muscle relaxants or stronger pain medications. Treat conservatively. If he fails home exercises told him we could refer him to PT in future. Patient verbalized understanding and agreement with plan.   Exposure to STD  Will repeat urine cytology for trich per patient request.     Return if symptoms worsen or fail to improve.   Dolores PattyAngela Riccio, DO Family Medicine Resident PGY-3

## 2018-04-27 NOTE — Patient Instructions (Signed)
Nice to meet you today. Try over the counter muscle rubs Use tylenol and/or ibuprofen as needed  Do the below exercises daily if possible  If you feel you need formal PT let me know I'll refer you.  If you have questions or concerns please do not hesitate to call at [phone].  Dolores Patty, DO PGY-3, [facility] 04/27/2018 4:26 PM   Low Back Sprain Rehab Ask your health care provider which exercises are safe for you. Do exercises exactly as told by your health care provider and adjust them as directed. It is normal to feel mild stretching, pulling, tightness, or discomfort as you do these exercises, but you should stop right away if you feel sudden pain or your pain gets worse. Do not begin these exercises until told by your health care provider. Stretching and range of motion exercises These exercises warm up your muscles and joints and improve the movement and flexibility of your back. These exercises also help to relieve pain, numbness, and tingling. Exercise A: Lumbar rotation  1. Lie on your back on a firm surface and bend your knees. 2. Straighten your arms out to your sides so each arm forms an "L" shape with a side of your body (a 90 degree angle). 3. Slowly move both of your knees to one side of your body until you feel a stretch in your lower back. Try not to let your shoulders move off of the floor. 4. Hold for __________ seconds. 5. Tense your abdominal muscles and slowly move your knees back to the starting position. 6. Repeat this exercise on the other side of your body. Repeat __________ times. Complete this exercise __________ times a day. Exercise B: Prone extension on elbows  1. Lie on your abdomen on a firm surface. 2. Prop yourself up on your elbows. 3. Use your arms to help lift your chest up until you feel a gentle stretch in your abdomen and your lower back. ? This will place some of your body weight on your elbows. If this is uncomfortable,  try stacking pillows under your chest. ? Your hips should stay down, against the surface that you are lying on. Keep your hip and back muscles relaxed. 4. Hold for __________ seconds. 5. Slowly relax your upper body and return to the starting position. Repeat __________ times. Complete this exercise __________ times a day. Strengthening exercises These exercises build strength and endurance in your back. Endurance is the ability to use your muscles for a long time, even after they get tired. Exercise C: Pelvic tilt 1. Lie on your back on a firm surface. Bend your knees and keep your feet flat. 2. Tense your abdominal muscles. Tip your pelvis up toward the ceiling and flatten your lower back into the floor. ? To help with this exercise, you may place a small towel under your lower back and try to push your back into the towel. 3. Hold for __________ seconds. 4. Let your muscles relax completely before you repeat this exercise. Repeat __________ times. Complete this exercise __________ times a day. Exercise D: Alternating arm and leg raises  1. Get on your hands and knees on a firm surface. If you are on a hard floor, you may want to use padding to cushion your knees, such as an exercise mat. 2. Line up your arms and legs. Your hands should be below your shoulders, and your knees should be below your hips. 3. Lift your left leg behind you. At  the same time, raise your right arm and straighten it in front of you. ? Do not lift your leg higher than your hip. ? Do not lift your arm higher than your shoulder. ? Keep your abdominal and back muscles tight. ? Keep your hips facing the ground. ? Do not arch your back. ? Keep your balance carefully, and do not hold your breath. 4. Hold for __________ seconds. 5. Slowly return to the starting position and repeat with your right leg and your left arm. Repeat __________ times. Complete this exercise __________ times a day. Exercise E: Abdominal set  with straight leg raise  1. Lie on your back on a firm surface. 2. Bend one of your knees and keep your other leg straight. 3. Tense your abdominal muscles and lift your straight leg up, 4-6 inches (10-15 cm) off the ground. 4. Keep your abdominal muscles tight and hold for __________ seconds. ? Do not hold your breath. ? Do not arch your back. Keep it flat against the ground. 5. Keep your abdominal muscles tense as you slowly lower your leg back to the starting position. 6. Repeat with your other leg. Repeat __________ times. Complete this exercise __________ times a day. Posture and body mechanics  Body mechanics refers to the movements and positions of your body while you do your daily activities. Posture is part of body mechanics. Good posture and healthy body mechanics can help to relieve stress in your body's tissues and joints. Good posture means that your spine is in its natural S-curve position (your spine is neutral), your shoulders are pulled back slightly, and your head is not tipped forward. The following are general guidelines for applying improved posture and body mechanics to your everyday activities. Standing   When standing, keep your spine neutral and your feet about hip-width apart. Keep a slight bend in your knees. Your ears, shoulders, and hips should line up.  When you do a task in which you stand in one place for a long time, place one foot up on a stable object that is 2-4 inches (5-10 cm) high, such as a footstool. This helps keep your spine neutral. Sitting   When sitting, keep your spine neutral and keep your feet flat on the floor. Use a footrest, if necessary, and keep your thighs parallel to the floor. Avoid rounding your shoulders, and avoid tilting your head forward.  When working at a desk or a computer, keep your desk at a height where your hands are slightly lower than your elbows. Slide your chair under your desk so you are close enough to maintain good  posture.  When working at a computer, place your monitor at a height where you are looking straight ahead and you do not have to tilt your head forward or downward to look at the screen. Resting   When lying down and resting, avoid positions that are most painful for you.  If you have pain with activities such as sitting, bending, stooping, or squatting (flexion-based activities), lie in a position in which your body does not bend very much. For example, avoid curling up on your side with your arms and knees near your chest (fetal position).  If you have pain with activities such as standing for a long time or reaching with your arms (extension-based activities), lie with your spine in a neutral position and bend your knees slightly. Try the following positions:  Lying on your side with a pillow between your knees.  Lying  on your back with a pillow under your knees. Lifting   When lifting objects, keep your feet at least shoulder-width apart and tighten your abdominal muscles.  Bend your knees and hips and keep your spine neutral. It is important to lift using the strength of your legs, not your back. Do not lock your knees straight out.  Always ask for help to lift heavy or awkward objects. This information is not intended to replace advice given to you by your health care provider. Make sure you discuss any questions you have with your health care provider. Document Released: 07/20/2005 Document Revised: 03/26/2016 Document Reviewed: 05/01/2015 Elsevier Interactive Patient Education  [employer].

## 2018-04-28 ENCOUNTER — Encounter: Payer: Self-pay | Admitting: Family Medicine

## 2018-04-28 DIAGNOSIS — M545 Low back pain, unspecified: Secondary | ICD-10-CM | POA: Insufficient documentation

## 2018-04-28 DIAGNOSIS — G8929 Other chronic pain: Secondary | ICD-10-CM | POA: Insufficient documentation

## 2018-04-28 LAB — URINE CYTOLOGY ANCILLARY ONLY
CHLAMYDIA, DNA PROBE: NEGATIVE
NEISSERIA GONORRHEA: NEGATIVE
TRICH (WINDOWPATH): NEGATIVE

## 2018-04-28 NOTE — Assessment & Plan Note (Signed)
  Will repeat urine cytology for trich per patient request.

## 2018-04-28 NOTE — Assessment & Plan Note (Addendum)
  Had a long discussion with patient. Reviewed imaging from 2016 without acute cause for pain and no degenerative changes. On that study the history was given as 3 years of back pain bringing his total years of pain to 6. Discussed these results with patient. Pain is muscular in nature. Discussed back rehab exercises, gave patient handout with instructions to do these daily. Continue tylenol and ibuprofen as needed for pain. Can add heating pad at night time for relief and help sleeping. Asked him to try OTC topical muscle pain relief creams. Would not give any muscle relaxants or stronger pain medications. Treat conservatively. If he fails home exercises told him we could refer him to PT in future. Patient verbalized understanding and agreement with plan.

## 2018-04-29 NOTE — Progress Notes (Signed)
Please let Mr. Kampa know STD check was negative. Thank you

## 2019-01-10 DIAGNOSIS — Z5181 Encounter for therapeutic drug level monitoring: Secondary | ICD-10-CM | POA: Diagnosis not present

## 2019-01-12 DIAGNOSIS — Z5181 Encounter for therapeutic drug level monitoring: Secondary | ICD-10-CM | POA: Diagnosis not present

## 2019-01-17 DIAGNOSIS — Z5181 Encounter for therapeutic drug level monitoring: Secondary | ICD-10-CM | POA: Diagnosis not present

## 2019-01-19 DIAGNOSIS — Z5181 Encounter for therapeutic drug level monitoring: Secondary | ICD-10-CM | POA: Diagnosis not present

## 2019-01-24 DIAGNOSIS — Z5181 Encounter for therapeutic drug level monitoring: Secondary | ICD-10-CM | POA: Diagnosis not present

## 2019-01-26 DIAGNOSIS — Z5181 Encounter for therapeutic drug level monitoring: Secondary | ICD-10-CM | POA: Diagnosis not present

## 2019-02-01 DIAGNOSIS — Z5181 Encounter for therapeutic drug level monitoring: Secondary | ICD-10-CM | POA: Diagnosis not present

## 2019-02-03 DIAGNOSIS — Z5181 Encounter for therapeutic drug level monitoring: Secondary | ICD-10-CM | POA: Diagnosis not present

## 2019-02-07 DIAGNOSIS — Z5181 Encounter for therapeutic drug level monitoring: Secondary | ICD-10-CM | POA: Diagnosis not present

## 2019-02-09 DIAGNOSIS — Z5181 Encounter for therapeutic drug level monitoring: Secondary | ICD-10-CM | POA: Diagnosis not present

## 2019-02-14 DIAGNOSIS — Z5181 Encounter for therapeutic drug level monitoring: Secondary | ICD-10-CM | POA: Diagnosis not present

## 2019-02-16 DIAGNOSIS — Z5181 Encounter for therapeutic drug level monitoring: Secondary | ICD-10-CM | POA: Diagnosis not present

## 2019-02-17 ENCOUNTER — Other Ambulatory Visit: Payer: Self-pay

## 2019-02-17 ENCOUNTER — Ambulatory Visit: Payer: Medicaid Other | Admitting: Family Medicine

## 2019-02-17 ENCOUNTER — Other Ambulatory Visit (HOSPITAL_COMMUNITY)
Admission: RE | Admit: 2019-02-17 | Discharge: 2019-02-17 | Disposition: A | Discharge status: Home / Self Care | Payer: Medicaid Other | Source: Ambulatory Visit | Attending: Family Medicine | Admitting: Family Medicine

## 2019-02-17 VITALS — BP 110/60 | HR 52

## 2019-02-17 DIAGNOSIS — Z114 Encounter for screening for human immunodeficiency virus [HIV]: Secondary | ICD-10-CM | POA: Diagnosis not present

## 2019-02-17 DIAGNOSIS — Z202 Contact with and (suspected) exposure to infections with a predominantly sexual mode of transmission: Secondary | ICD-10-CM | POA: Insufficient documentation

## 2019-02-17 DIAGNOSIS — G8929 Other chronic pain: Secondary | ICD-10-CM

## 2019-02-17 DIAGNOSIS — M545 Low back pain, unspecified: Secondary | ICD-10-CM

## 2019-02-17 NOTE — Patient Instructions (Signed)
Thank you for coming to see me today.  I have collected a urine and blood samples to check for STDs.  I will call you if anything is positive, otherwise I will send you a letter with your results.  I think your back pain is a combination of tight muscles and strain from heavy lifting from your job.  I recommend over-the-counter icy hot patches with or without lidocaine and Voltaren gel.  You can try to take Tylenol or ibuprofen before bed to help with preventing the pain.  I have also sent in a referral to physical therapy who will reach out to you to schedule a date to meet.  Please call me if you have any other questions or concerns.  Take care, Dr. Tarry Kos

## 2019-02-17 NOTE — Progress Notes (Signed)
Subjective:   Patient ID: Scott Wheeler    DOB: 12-14-1990, 28 y.o. male   MRN: [id number]  Scott Wheeler is a 28 y.o. male here for STD check and back pain.  STD Check: Patient notes he has not been to the doctor in a while and would like to be tested for STD's. He has a history of trichomonas last year that he reports him and his girl friend were adequately treated. He denies any current penile discharge or dysuria. He is sexually active with one sexual male partner in last 12 months and does not use condoms.  Denies any lesions in genital area.  Back Pain: Lower back pain x years. Worse at night making it difficult for him to sleep. Eases up with walking. Has tried ibuprofen and tylenol without relief.  Tried home exercises without much improvement. Heating pad without improvement as well. Denies any history of trauma or injury to the back but does do heavy lifting daily for job (lifts 60-70lbs, everyday). He is involved in making fabric for cars, face masks, and beds. He had lumbar Spine x-ray in 2016 that was negative for acute findings or other abnormalities.Denies any numbness or tingling or hip pain.  Review of Systems:  Per HPI.   PMFSH, medications and smoking status reviewed.  Objective:   BP 110/60   Pulse (!) 52   SpO2 100%  Vitals and nursing note reviewed.  General: well nourished, well developed, in no acute distress with non-toxic appearance, sitting comfortably in exam chair Lungs: breathing comfortably on room air Skin: warm, dry, no rashes or lesions Extremities: warm and well perfused, normal tone Genitals: deferred   Lumbar spine: - Inspection: no gross deformity or asymmetry, swelling or ecchymosis - Palpation: No TTP over the spinous processes, paraspinal muscles, or SI joints b/l - ROM: full active ROM of the lumbar spine in flexion and extension without pain - Strength: 5/5 strength of lower extremity in L4-S1 nerve root distributions b/l; normal  gait - Neuro: sensation intact in the L4-S1 nerve root distribution b/l - Special testing: Negative straight leg raise, negative Stork test, Negative FABER   Assessment & Plan:   Chronic midline low back pain without sciatica Chronic and unchanged. No red flag symptoms or findings on exam. No improvement with treatment modalities tried thus far including home exercises, OTC tylenol/ibuprofen, and heating pad. Reviewed prior imaging which was negative for acute abnormalities or degenerative changes and provided reassurance to patient. Chronic daily heavy lifting is likely contributing. Recommended OTC icy-hot w/ or w/out lidocaine and Voltaren gel PRN, tylenol/ibuprofen prior to bed, and physical therapy to help with exercises focused on his low back and correct lifting techniques. Given chronicity of this problem, do not think a muscle relaxer is a good idea.  Discussed expectation going forward and unlikely to completely resolve back pain. Patient understood. However, if no improvement with above measures can consider referral to sports medicine clinic for further evaluation.    Exposure to STD GC/Cl, trich, HIV, and RPR collected. Will notify patient of any positive results and treat if indicated.  Orders Placed This Encounter  Procedures  . HIV antibody (with reflex)  . RPR  . Ambulatory referral to Physical Therapy    Referral Priority:   Routine    Referral Type:   Physical Medicine    Referral Reason:   Specialty Services Required    Requested Specialty:   Physical Therapy    Number of Visits Requested:  [place], DO PGY-2, [employer] [facility] 02/18/2019 9:42 AM

## 2019-02-18 LAB — RPR: RPR Ser Ql: NONREACTIVE

## 2019-02-18 LAB — HIV ANTIBODY (ROUTINE TESTING W REFLEX): HIV Screen 4th Generation wRfx: NONREACTIVE

## 2019-02-18 NOTE — Assessment & Plan Note (Signed)
GC/Cl, trich, HIV, and RPR collected. Will notify patient of any positive results and treat if indicated.

## 2019-02-18 NOTE — Assessment & Plan Note (Signed)
Chronic and unchanged. No red flag symptoms or findings on exam. No improvement with treatment modalities tried thus far including home exercises, OTC tylenol/ibuprofen, and heating pad. Reviewed prior imaging which was negative for acute abnormalities or degenerative changes and provided reassurance to patient. Chronic daily heavy lifting is likely contributing. Recommended OTC icy-hot w/ or w/out lidocaine and Voltaren gel PRN, tylenol/ibuprofen prior to bed, and physical therapy to help with exercises focused on his low back and correct lifting techniques. Given chronicity of this problem, do not think a muscle relaxer is a good idea.  Discussed expectation going forward and unlikely to completely resolve back pain. Patient understood. However, if no improvement with above measures can consider referral to sports medicine clinic for further evaluation.

## 2019-02-21 DIAGNOSIS — Z5181 Encounter for therapeutic drug level monitoring: Secondary | ICD-10-CM | POA: Diagnosis not present

## 2019-02-22 LAB — URINE CYTOLOGY ANCILLARY ONLY
Chlamydia: NEGATIVE
Neisseria Gonorrhea: NEGATIVE
Trichomonas: POSITIVE — AB

## 2019-02-23 ENCOUNTER — Other Ambulatory Visit: Payer: Self-pay | Admitting: Family Medicine

## 2019-02-23 DIAGNOSIS — A599 Trichomoniasis, unspecified: Secondary | ICD-10-CM

## 2019-02-23 DIAGNOSIS — Z202 Contact with and (suspected) exposure to infections with a predominantly sexual mode of transmission: Secondary | ICD-10-CM

## 2019-02-23 DIAGNOSIS — Z5181 Encounter for therapeutic drug level monitoring: Secondary | ICD-10-CM | POA: Diagnosis not present

## 2019-02-23 MED ORDER — METRONIDAZOLE 500 MG PO TABS: 500.0000 mg | ORAL_TABLET | Freq: Two times a day (BID) | ORAL | 0 refills | Status: AC

## 2019-02-23 NOTE — Progress Notes (Signed)
Spoke to patient. Informed him of his positive test for Trichomonas. Rx for Metronidazole 500mg  BID x 7 days sent into pharmacy. Informed him all partners will need to be tested and treated. Patient informed to make follow up visit after treatment for a test of cure. He understood and agreed to plan.

## 2019-02-28 DIAGNOSIS — Z5181 Encounter for therapeutic drug level monitoring: Secondary | ICD-10-CM | POA: Diagnosis not present

## 2019-03-02 DIAGNOSIS — Z5181 Encounter for therapeutic drug level monitoring: Secondary | ICD-10-CM | POA: Diagnosis not present

## 2019-03-03 ENCOUNTER — Ambulatory Visit: Payer: Medicaid Other | Attending: Family Medicine | Admitting: Physical Therapy

## 2019-03-07 DIAGNOSIS — Z5181 Encounter for therapeutic drug level monitoring: Secondary | ICD-10-CM | POA: Diagnosis not present

## 2019-03-09 DIAGNOSIS — Z5181 Encounter for therapeutic drug level monitoring: Secondary | ICD-10-CM | POA: Diagnosis not present

## 2019-03-14 ENCOUNTER — Ambulatory Visit: Payer: Medicaid Other | Admitting: Family Medicine

## 2019-03-14 ENCOUNTER — Other Ambulatory Visit (HOSPITAL_COMMUNITY)
Admission: RE | Admit: 2019-03-14 | Discharge: 2019-03-14 | Disposition: A | Discharge status: Home / Self Care | Payer: Medicaid Other | Source: Ambulatory Visit | Attending: Family Medicine | Admitting: Family Medicine

## 2019-03-14 ENCOUNTER — Other Ambulatory Visit: Payer: Self-pay

## 2019-03-14 VITALS — BP 106/64 | HR 63

## 2019-03-14 DIAGNOSIS — Z202 Contact with and (suspected) exposure to infections with a predominantly sexual mode of transmission: Secondary | ICD-10-CM

## 2019-03-14 DIAGNOSIS — Z5181 Encounter for therapeutic drug level monitoring: Secondary | ICD-10-CM | POA: Diagnosis not present

## 2019-03-14 DIAGNOSIS — A599 Trichomoniasis, unspecified: Secondary | ICD-10-CM | POA: Diagnosis not present

## 2019-03-14 NOTE — Progress Notes (Signed)
    Subjective:  Scott Wheeler is a 28 y.o. male who presents to the [facility] today for test of cure of recent STD.   HPI: Patient tested positive for trichomonas on 02/17/2019.  He was treated with a course of tonight's all.  Patient is completed his antibiotic course and not missed any.  Patient denies any genital pain, urinary discharge, penile discharge, hematuria, fever, chills, abdominal pain.  Patient partner was also successfully treated and is planning to come in for test of cure in the next few days.  ROS: Per HPI   Objective:  Physical Exam: BP 106/64   Pulse 63   SpO2 99%   Gen: NAD, resting comfortably Pulm: NWOB,  GI: Soft, Nontender, Nondistended. MSK: no edema, cyanosis, or clubbing noted Skin: warm, dry Neuro: grossly normal, moves all extremities Psych: Normal affect and thought content  No results found for this or any previous visit (from the past 72 hour(s)).   Assessment/Plan:   Problem List Items Addressed This Visit      Other   Exposure to STD - Primary   Relevant Orders   Urine cytology ancillary only   Trichomoniasis     Patient presents for test of cure for Trichomonas.  Patient urine collected today.  Patient is asymptomatic otherwise.  Partner is also received treatment and will get test of cure in the next few days.  Will call patient with results.  Patient return if develops any symptoms.   Marny Lowenstein, MD, MS FAMILY MEDICINE RESIDENT - PGY3 03/14/2019 3:11 PM

## 2019-03-15 ENCOUNTER — Telehealth: Payer: Self-pay | Admitting: Family Medicine

## 2019-03-15 LAB — URINE CYTOLOGY ANCILLARY ONLY
Chlamydia: NEGATIVE
Neisseria Gonorrhea: NEGATIVE
Trichomonas: NEGATIVE

## 2019-03-15 NOTE — Telephone Encounter (Signed)
Please call patient and inform that STD testing was negative. Thank you.

## 2019-03-16 DIAGNOSIS — Z5181 Encounter for therapeutic drug level monitoring: Secondary | ICD-10-CM | POA: Diagnosis not present

## 2019-03-21 DIAGNOSIS — Z5181 Encounter for therapeutic drug level monitoring: Secondary | ICD-10-CM | POA: Diagnosis not present

## 2019-03-23 DIAGNOSIS — Z5181 Encounter for therapeutic drug level monitoring: Secondary | ICD-10-CM | POA: Diagnosis not present

## 2019-03-27 DIAGNOSIS — Z5181 Encounter for therapeutic drug level monitoring: Secondary | ICD-10-CM | POA: Diagnosis not present

## 2019-03-29 DIAGNOSIS — Z5181 Encounter for therapeutic drug level monitoring: Secondary | ICD-10-CM | POA: Diagnosis not present

## 2019-04-03 DIAGNOSIS — Z5181 Encounter for therapeutic drug level monitoring: Secondary | ICD-10-CM | POA: Diagnosis not present

## 2019-04-05 DIAGNOSIS — Z5181 Encounter for therapeutic drug level monitoring: Secondary | ICD-10-CM | POA: Diagnosis not present

## 2019-04-10 DIAGNOSIS — Z5181 Encounter for therapeutic drug level monitoring: Secondary | ICD-10-CM | POA: Diagnosis not present

## 2019-04-12 DIAGNOSIS — Z5181 Encounter for therapeutic drug level monitoring: Secondary | ICD-10-CM | POA: Diagnosis not present

## 2019-04-17 DIAGNOSIS — Z5181 Encounter for therapeutic drug level monitoring: Secondary | ICD-10-CM | POA: Diagnosis not present

## 2019-04-19 DIAGNOSIS — Z5181 Encounter for therapeutic drug level monitoring: Secondary | ICD-10-CM | POA: Diagnosis not present

## 2019-08-08 ENCOUNTER — Other Ambulatory Visit: Payer: Self-pay

## 2019-08-08 ENCOUNTER — Other Ambulatory Visit (HOSPITAL_COMMUNITY)
Admission: RE | Admit: 2019-08-08 | Discharge: 2019-08-08 | Disposition: A | Discharge status: Home / Self Care | Payer: Medicaid Other | Source: Ambulatory Visit | Attending: Family Medicine | Admitting: Family Medicine

## 2019-08-08 ENCOUNTER — Ambulatory Visit (INDEPENDENT_AMBULATORY_CARE_PROVIDER_SITE_OTHER): Payer: Medicaid Other | Admitting: Family Medicine

## 2019-08-08 VITALS — BP 125/70 | HR 64 | Wt 150.4 lb

## 2019-08-08 DIAGNOSIS — Z202 Contact with and (suspected) exposure to infections with a predominantly sexual mode of transmission: Secondary | ICD-10-CM | POA: Insufficient documentation

## 2019-08-08 DIAGNOSIS — Z113 Encounter for screening for infections with a predominantly sexual mode of transmission: Secondary | ICD-10-CM

## 2019-08-08 NOTE — Patient Instructions (Signed)
It was great to see you!  Our plans for today:  - We are testing your urine for sexually transmitted infections per your request. - Be sure to wear a condom EVERY time you have sex for the full time to prevent sexually transmitted infections.  Take care and seek immediate care sooner if you develop any concerns.   Dr. [facility]

## 2019-08-08 NOTE — Assessment & Plan Note (Signed)
Screening for STIs done today per patient request, will call with results.  Feels as if "something stuck" after urinating but denies dysuria, discharge, rashes, ulcers.  Declines HIV, RPR testing, previously negative in August.  Safe sex practices reviewed.

## 2019-08-08 NOTE — Progress Notes (Signed)
  Subjective:   Patient ID: Scott Wheeler    DOB: May 29, 1991, 29 y.o. male   MRN: [id number]  Scott Wheeler is a 29 y.o. male with a history of chronic LBP here for   STD check Wants to be tested for STIs.  Does have prior history of trichomonas, s/p treatment with negative test of cure.  Denies any penile discharge or dysuria but does note after he urinates he sometimes feels as if something is "stuck."  Denies any rashes or ulcers.  Denies any abnormal lumps or bumps in testicules.  Denies any new sexual partners.  Is sexually active with one male partner, does not use barrier protection or contraception.  He is otherwise eating and drinking normally urinating and stooling normally.  Review of Systems:  Per HPI.  Medications and smoking status reviewed.  Objective:   BP 125/70   Pulse 64   Wt 150 lb 6.4 oz (68.2 kg)   SpO2 100%   BMI 20.98 kg/m  Vitals and nursing note reviewed.  General: well nourished, well developed, in no acute distress with non-toxic appearance CV: regular rate and rhythm without murmurs, rubs, or gallops Lungs: clear to auscultation bilaterally with normal work of breathing Abdomen: soft, non-tender, normoactive bowel sounds Skin: warm, dry, no rashes or lesions  Assessment & Plan:   Exposure to STD Screening for STIs done today per patient request, will call with results.  Feels as if "something stuck" after urinating but denies dysuria, discharge, rashes, ulcers.  Declines HIV, RPR testing, previously negative in August.  Safe sex practices reviewed.  No orders of the defined types were placed in this encounter.  No orders of the defined types were placed in this encounter.   Ellwood Dense, DO PGY-3,  Family Medicine 08/08/2019 1:53 PM

## 2019-08-09 ENCOUNTER — Encounter: Payer: Self-pay | Admitting: Family Medicine

## 2019-08-09 LAB — URINE CYTOLOGY ANCILLARY ONLY
Chlamydia: NEGATIVE
Comment: NEGATIVE
Comment: NEGATIVE
Comment: NORMAL
Neisseria Gonorrhea: NEGATIVE
Trichomonas: POSITIVE — AB

## 2019-08-09 MED ORDER — METRONIDAZOLE 500 MG PO TABS: 500.0000 mg | ORAL_TABLET | Freq: Two times a day (BID) | ORAL | 0 refills | Status: DC

## 2019-08-11 ENCOUNTER — Telehealth: Payer: Self-pay | Admitting: Student in an Organized Health Care Education/Training Program

## 2019-08-11 MED ORDER — METRONIDAZOLE 500 MG PO TABS: 500.0000 mg | ORAL_TABLET | Freq: Two times a day (BID) | ORAL | 0 refills | Status: DC

## 2019-08-11 NOTE — Telephone Encounter (Signed)
Medication resent to correct pharmacy.  Santia Labate,CMA  

## 2019-08-11 NOTE — Telephone Encounter (Signed)
Patient calls because the Flagyl was sent to the wrong pharmacy. It needs to be sent to [facility] on [address] in [place]. Please advise.  Routing to blue team since patient saw [place].

## 2019-08-15 ENCOUNTER — Telehealth: Payer: Self-pay | Admitting: *Deleted

## 2019-08-15 NOTE — Telephone Encounter (Signed)
Pt states that he was told at his visit that his partner could also be treated from trichomoniasis from our office.  I do not see mention of this in chart so will forward to Dr. Linwood Dibbles.  Partners Name: Jodene Nam Partners DOB: [birth year] Partners Phone: [phone] Partners Pharmacy: [facility] in [place]  Pt does not think that she has any allergies but states that we can call and ask her if needed.  To Dr. Linwood Dibbles to advise.  Jone Baseman, CMA

## 2019-08-15 NOTE — Telephone Encounter (Signed)
Scott Dense, DO  08/09/2019  5:08 PM EST    Spoke to patient regarding result. Advised to inform partner of positive result. Will send metronidazole 500mg  BID x7 days to pharmacy.    Patient's partner will be receiving metronidazole 500mg  BID x7 days as a part of EPT program.    Partner name:  Partner DOB: 10/08/1988 Partner #: [phone]   Sent above note to RN team after speaking with patient about results. He was to come and pick up EPT from clinic. His meds were sent to his pharmacy per our protocol. Did he pick up her meds from clinic?

## 2019-08-15 NOTE — Telephone Encounter (Signed)
I apologize, I never saw that!  I even went back to look for it now but still am unable to locate that message.  I have completed the form and faxed down to outpatient pharmacy. Sorry for the confusion earlier  Pts significant other is aware Jone Baseman, CMA

## 2019-08-15 NOTE — Telephone Encounter (Signed)
Not a problem, thanks for your help!! :)

## 2019-09-01 ENCOUNTER — Encounter: Payer: Self-pay | Admitting: Family Medicine

## 2019-09-01 ENCOUNTER — Ambulatory Visit (INDEPENDENT_AMBULATORY_CARE_PROVIDER_SITE_OTHER): Payer: Medicaid Other | Admitting: Family Medicine

## 2019-09-01 ENCOUNTER — Other Ambulatory Visit: Payer: Self-pay

## 2019-09-01 ENCOUNTER — Other Ambulatory Visit (HOSPITAL_COMMUNITY)
Admission: RE | Admit: 2019-09-01 | Discharge: 2019-09-01 | Disposition: A | Discharge status: Home / Self Care | Payer: Medicaid Other | Source: Ambulatory Visit | Attending: Family Medicine | Admitting: Family Medicine

## 2019-09-01 VITALS — BP 122/80 | HR 80 | Wt 147.0 lb

## 2019-09-01 DIAGNOSIS — Z113 Encounter for screening for infections with a predominantly sexual mode of transmission: Secondary | ICD-10-CM | POA: Diagnosis not present

## 2019-09-01 DIAGNOSIS — A599 Trichomoniasis, unspecified: Secondary | ICD-10-CM | POA: Diagnosis not present

## 2019-09-01 DIAGNOSIS — R112 Nausea with vomiting, unspecified: Secondary | ICD-10-CM | POA: Diagnosis not present

## 2019-09-01 MED ORDER — ONDANSETRON HCL 4 MG PO TABS: 4.0000 mg | ORAL_TABLET | Freq: Three times a day (TID) | ORAL | 0 refills | Status: DC | PRN

## 2019-09-01 MED ORDER — ONDANSETRON HCL 4 MG PO TABS: 4.0000 mg | ORAL_TABLET | Freq: Three times a day (TID) | ORAL | 1 refills | Status: DC | PRN

## 2019-09-01 NOTE — Patient Instructions (Addendum)
It was great meeting you today!  We performed a urine test to evaluate and see if you are still positive after treatment.  I will give you call with results on Monday.  We discussed your vomiting a little bit.  If you would like for [place] to do some testing to figure out why you are doing this please let us know at some point.  I did send you a prescription for some Zofran to help with your nausea.  I gave you 30 tablets and a refill.

## 2019-09-04 ENCOUNTER — Encounter: Payer: Self-pay | Admitting: Family Medicine

## 2019-09-04 DIAGNOSIS — R1110 Vomiting, unspecified: Secondary | ICD-10-CM | POA: Insufficient documentation

## 2019-09-04 NOTE — Assessment & Plan Note (Signed)
Oddly patient has 1 episode of emesis on most mornings and feels fine after that.  It is not related to what he eats in the morning, his morning routine, or any other factors as he has tried numerous things in the past.  It is responsive to Zofran in the morning.  Sent home with Zofran 4 mg every 8 hours as needed.  Patient is not interested in getting further work-up for this at this time, asked him to please let us know when he would like to follow-up for this.

## 2019-09-04 NOTE — Progress Notes (Signed)
   HPI 29 year old male who presents for trichomonas follow-up.  Sexual partner was found to be positive for trichomoniasis on 08/15/2019, he was brought in for expedited partner therapy.  He was given the usual course of Flagyl twice daily for 7 days.  Unfortunately patient has chronic morning emesis on most days, and he is unsure if he has been able to keep down enough the Flagyl to be adequately treated.  He was also apparently told to come back in a few weeks to get rechecked.  Of note the patient has chronic morning emesis on most days.  Almost always occurs on an empty stomach.  States that he will most of the time just be walking the dog or driving to work when the urge to vomit ever takes him.  He vomits 1 time and feels back to normal.  He does not smoke any marijuana.  He smokes around 3 black and mild cigarettes per day, and never has to wake up to smoke due to cravings.  States that he has been given Zofran for it in the past and that this actually has helped his symptoms quite a bit  CC: Trichomonas follow-up   ROS:   Review of Systems See HPI for ROS.   CC, SH/smoking status, and VS noted  Objective: BP 122/80   Pulse 80   Wt 147 lb (66.7 kg)   SpO2 98%   BMI 20.50 kg/m  Gen: Very pleasant 29 year old African-American male, no acute distress CV: Skin warm and dry Resp: No accessory muscle use, no respiratory distress Abd: Soft, nontender, nondistended Neuro: Alert and oriented, Speech clear, No gross deficits   Assessment and plan:  Trichomoniasis Obtain urine sample today for testing.  Given his inability keep down some Flagyl in the short time course between follow-up, do expect this to be positive but will await test to confirm.  Can do Flagyl 500 mg twice daily for 7 days again.  This time sent him home with Zofran 4 mg as needed every 8 hours.  Emesis Oddly patient has 1 episode of emesis on most mornings and feels fine after that.  It is not related to what he  eats in the morning, his morning routine, or any other factors as he has tried numerous things in the past.  It is responsive to Zofran in the morning.  Sent home with Zofran 4 mg every 8 hours as needed.  Patient is not interested in getting further work-up for this at this time, asked him to please let us know when he would like to follow-up for this.   No orders of the defined types were placed in this encounter.   Meds ordered this encounter  Medications  . DISCONTD: ondansetron (ZOFRAN) 4 MG tablet    Sig: Take 1 tablet (4 mg total) by mouth every 8 (eight) hours as needed for nausea or vomiting.    Dispense:  30 tablet    Refill:  0  . ondansetron (ZOFRAN) 4 MG tablet    Sig: Take 1 tablet (4 mg total) by mouth every 8 (eight) hours as needed for nausea or vomiting.    Dispense:  30 tablet    Refill:  1     Myrene Buddy MD PGY-3 Family Medicine Resident  09/04/2019 7:01 PM

## 2019-09-04 NOTE — Assessment & Plan Note (Signed)
Obtain urine sample today for testing.  Given his inability keep down some Flagyl in the short time course between follow-up, do expect this to be positive but will await test to confirm.  Can do Flagyl 500 mg twice daily for 7 days again.  This time sent him home with Zofran 4 mg as needed every 8 hours.

## 2019-09-05 LAB — URINE CYTOLOGY ANCILLARY ONLY
Chlamydia: NEGATIVE
Comment: NEGATIVE
Comment: NORMAL
Neisseria Gonorrhea: NEGATIVE

## 2019-09-06 DIAGNOSIS — Z20828 Contact with and (suspected) exposure to other viral communicable diseases: Secondary | ICD-10-CM | POA: Diagnosis not present

## 2020-04-26 ENCOUNTER — Other Ambulatory Visit: Payer: Self-pay

## 2020-04-26 ENCOUNTER — Ambulatory Visit (INDEPENDENT_AMBULATORY_CARE_PROVIDER_SITE_OTHER): Payer: 59 | Admitting: Student in an Organized Health Care Education/Training Program

## 2020-04-26 ENCOUNTER — Other Ambulatory Visit (HOSPITAL_COMMUNITY)
Admission: RE | Admit: 2020-04-26 | Discharge: 2020-04-26 | Disposition: A | Discharge status: Home / Self Care | Payer: 59 | Source: Ambulatory Visit | Attending: Family Medicine | Admitting: Family Medicine

## 2020-04-26 ENCOUNTER — Encounter: Payer: Self-pay | Admitting: Student in an Organized Health Care Education/Training Program

## 2020-04-26 VITALS — BP 130/74 | HR 63 | Wt 145.4 lb

## 2020-04-26 DIAGNOSIS — Z202 Contact with and (suspected) exposure to infections with a predominantly sexual mode of transmission: Secondary | ICD-10-CM | POA: Insufficient documentation

## 2020-04-26 DIAGNOSIS — M25511 Pain in right shoulder: Secondary | ICD-10-CM

## 2020-04-26 DIAGNOSIS — Z113 Encounter for screening for infections with a predominantly sexual mode of transmission: Secondary | ICD-10-CM

## 2020-04-26 MED ORDER — KETOROLAC TROMETHAMINE 10 MG PO TABS: 10.0000 mg | ORAL_TABLET | Freq: Three times a day (TID) | ORAL | 0 refills | Status: AC | PRN

## 2020-04-26 NOTE — Progress Notes (Signed)
    SUBJECTIVE:   CHIEF COMPLAINT / HPI: STD check, physical  R shoulder pain x3 months. Just woke up with pain that radiates down arm. No swelling, redness, weakness. Getting worse. Difficulty with lifting, pushing. Depends on the angle, some movements are OK. Shoulder pain always there. Pain/stiffness down to hand worse in the morning. ROM improves throughout the day. Has physically demanding job. Tried icy-hot, no PO pain meds.  STD- no known exposure. Just wanted to check. No discharge, dysuria, hematuria, scrotal swelling, ulcers, pain.  OBJECTIVE:   BP 130/74   Pulse 63   Wt 145 lb 6.4 oz (66 kg)   SpO2 99%   BMI 20.28 kg/m   Physical Exam: Gen: NAD, comfortable in exam room R shoulder Observation: no deformity, erythema Palpation: tenderness to palpation sub acromial area to soft tissue. Negative tenderness to [facility] joint ROM: intact and symmetric to L Strength: intact and symmetric to L Sensation: intact Special tests: neg hawking, empty can, neers, lift-off test  ASSESSMENT/PLAN:   Shoulder pain Soft tissue area tenderness to subacromial area with full strength and ROM. Likely bursitis R shoulder.  Patient has not tried PO pain medications. Icy-hot gives some relief. - toradol x5 days - rest as able - continue topicals including applying heat to area - return if not improved in a couple weeks for reexamination and to consider steroid injection which was discussed with patient  Screen for STD (sexually transmitted disease) No known exposure and no symptoms.  Screen gc/chlamydia, hiv, RPR, hep C - negative, patient letter drafted     Leeroy Bock, DO [facility]

## 2020-04-26 NOTE — Patient Instructions (Signed)
It was a pleasure to see you today!  To summarize our discussion for this visit:  It looks like you have swelling of the bursa in your shoulder. We will try to rest it and use an anti-inflammatory for 5 days with heating pads and topicals to see if it can improve the pain. If still not better in a couple weeks, let us know and we can consider a steroid injection or a referral to sports medicine  I will contact your with STD testing results.   Some additional health maintenance measures we should update are: Health Maintenance Due  Topic Date Due  . Hepatitis C Screening  Never done  . COVID-19 Vaccine (1) Never done  . INFLUENZA VACCINE  Never done  .   Call the clinic at [phone] if your symptoms worsen or you have any concerns.   Thank you for allowing me to take part in your care,  Dr. Jamelle Rushing   Bursitis  Bursitis is when the fluid-filled sac (bursa) that covers and protects a joint is swollen (inflamed). Bursitis is most common near joints such as the knees, elbows, hips, and shoulders. It can cause pain and stiffness. Follow these instructions at home: Medicines  Take over-the-counter and prescription medicines only as told by your doctor.  If you were prescribed an antibiotic medicine, take it as told by your doctor. Do not stop taking it even if you start to feel better. General instructions   Rest the affected area as told by your doctor. ? If you can, raise (elevate) the affected area above the level of your heart while you are sitting or lying down. ? Avoid doing things that make the pain worse.  Use a splint, brace, pad, or walking aid as told by your doctor.  If directed, put ice on the affected area: ? If you have a removable splint or brace, take it off as told by your doctor. ? Put ice in a plastic bag. ? Place a towel between your skin and the bag, or between the splint or brace and the bag. ? Leave the ice on for 20 minutes, 2-3 times a  day.  Keep all follow-up visits as told by your doctor. This is important. Preventing symptoms Do these things to help you not have symptoms again:  Wear knee pads if you kneel often.  Wear sturdy running or walking shoes that fit you well.  Take a lot of breaks during activities that involve doing the same movements again and again.  Before you do any activity that takes a lot of effort, get your body ready by stretching.  Stay at a healthy weight or lose weight if your doctor says you should. If you need help doing this, ask your doctor.  Exercise often. If you start any new physical activity, do it slowly. Contact a doctor if you:  Have a fever.  Have chills.  Have symptoms that do not get better with treatment or home care. Summary  Bursitis is when the fluid-filled sac (bursa) that covers and protects a joint is swollen.  Rest the affected area as told by your doctor.  Avoid doing things that make the pain worse.  Put ice on the affected area as told by your doctor. This information is not intended to replace advice given to you by your health care provider. Make sure you discuss any questions you have with your health care provider. Document Revised: 07/02/2017 Document Reviewed: 06/04/2017 Elsevier Patient Education  [phone]  [employer].

## 2020-04-27 ENCOUNTER — Encounter (HOSPITAL_COMMUNITY): Payer: Self-pay | Admitting: Student in an Organized Health Care Education/Training Program

## 2020-04-27 DIAGNOSIS — Z113 Encounter for screening for infections with a predominantly sexual mode of transmission: Secondary | ICD-10-CM | POA: Insufficient documentation

## 2020-04-27 DIAGNOSIS — M25519 Pain in unspecified shoulder: Secondary | ICD-10-CM | POA: Insufficient documentation

## 2020-04-27 LAB — HEPATITIS C ANTIBODY: Hep C Virus Ab: <0.1 s/co ratio (ref 0.0–0.9)

## 2020-04-27 LAB — RPR: RPR Ser Ql: NONREACTIVE

## 2020-04-27 LAB — HIV ANTIBODY (ROUTINE TESTING W REFLEX): HIV Screen 4th Generation wRfx: NONREACTIVE

## 2020-04-27 NOTE — Assessment & Plan Note (Signed)
No known exposure and no symptoms.  Screen gc/chlamydia, hiv, RPR, hep C - negative, patient letter drafted

## 2020-04-27 NOTE — Assessment & Plan Note (Signed)
Soft tissue area tenderness to subacromial area with full strength and ROM. Likely bursitis R shoulder.  Patient has not tried PO pain medications. Icy-hot gives some relief. - toradol x5 days - rest as able - continue topicals including applying heat to area - return if not improved in a couple weeks for reexamination and to consider steroid injection which was discussed with patient

## 2020-04-30 LAB — URINE CYTOLOGY ANCILLARY ONLY
Chlamydia: NEGATIVE
Comment: NEGATIVE
Comment: NORMAL
Neisseria Gonorrhea: NEGATIVE

## 2020-08-12 ENCOUNTER — Ambulatory Visit (HOSPITAL_COMMUNITY)
Admission: RE | Admit: 2020-08-12 | Discharge: 2020-08-12 | Disposition: A | Discharge status: Home / Self Care | Payer: 59 | Source: Ambulatory Visit | Attending: Family Medicine | Admitting: Family Medicine

## 2020-08-12 ENCOUNTER — Encounter: Payer: Self-pay | Admitting: Family Medicine

## 2020-08-12 ENCOUNTER — Other Ambulatory Visit: Payer: Self-pay

## 2020-08-12 ENCOUNTER — Ambulatory Visit (INDEPENDENT_AMBULATORY_CARE_PROVIDER_SITE_OTHER): Payer: 59 | Admitting: Family Medicine

## 2020-08-12 ENCOUNTER — Other Ambulatory Visit (HOSPITAL_COMMUNITY)
Admission: RE | Admit: 2020-08-12 | Discharge: 2020-08-12 | Disposition: A | Discharge status: Home / Self Care | Payer: 59 | Source: Ambulatory Visit | Attending: Family Medicine | Admitting: Family Medicine

## 2020-08-12 VITALS — BP 122/78 | HR 66 | Ht 71.0 in | Wt 148.4 lb

## 2020-08-12 DIAGNOSIS — Z202 Contact with and (suspected) exposure to infections with a predominantly sexual mode of transmission: Secondary | ICD-10-CM | POA: Insufficient documentation

## 2020-08-12 DIAGNOSIS — R350 Frequency of micturition: Secondary | ICD-10-CM | POA: Diagnosis not present

## 2020-08-12 DIAGNOSIS — Z113 Encounter for screening for infections with a predominantly sexual mode of transmission: Secondary | ICD-10-CM | POA: Diagnosis not present

## 2020-08-12 DIAGNOSIS — R079 Chest pain, unspecified: Secondary | ICD-10-CM | POA: Diagnosis not present

## 2020-08-12 LAB — POCT URINALYSIS DIP (CLINITEK)
Bilirubin, UA: NEGATIVE
Blood, UA: NEGATIVE
Glucose, UA: NEGATIVE mg/dL
Ketones, POC UA: NEGATIVE mg/dL
Leukocytes, UA: NEGATIVE
Nitrite, UA: NEGATIVE
POC PROTEIN,UA: NEGATIVE
Spec Grav, UA: >=1.03 — AB (ref 1.010–1.025)
Urobilinogen, UA: 0.2 E.U./dL
pH, UA: 7 (ref 5.0–8.0)

## 2020-08-12 NOTE — Patient Instructions (Addendum)
Thank you for coming to see me today. It was a pleasure.   We will get some labs today.  If they are abnormal or we need to do something about them, I will call you.  If they are normal, I will send you a message on MyChart (if it is active) or a letter in the mail.  If you don't hear from us in 2 weeks, please call the office at the number below.   Please follow-up with PCP as needed  If you have any questions or concerns, please do not hesitate to call the office at [phone].  Best,   Hendy Brindle, MD   

## 2020-08-12 NOTE — Progress Notes (Signed)
    SUBJECTIVE:   CHIEF COMPLAINT / HPI: STI testing  Chest pain Patient reports chest tightness that has been ongoing for a few years. Intermittent and happens mostly when moving around alot. Sitting down relieves pain. Pain localized in middle of chest and does not radiate to neck, jaw, shoulder or arm.  Denies any SOB, cough or diaphoresis. He does endorse smoking and using THC. Chest pain is reproducible to touch. Has used Tylenol and Ibuprofen with some relief.  STI testing Patient reports urinary urgency and frequency.  He reports his wife had Trichomonas about 2 months ago and they were both treated. Endorses small amount of urethral discharge periodically.  Denies any dysuria,back pain or fevers.     PERTINENT  PMH / PSH:  Low back pain Previous exposure STI Tobacco use THC use  OBJECTIVE:   BP 122/78   Pulse 66   Ht 5\' 11"  (1.803 m)   Wt 148 lb 6.4 oz (67.3 kg)   SpO2 99%   BMI 20.70 kg/m    General: Alert, no acute distress Cardio: Normal S1 and S2, RRR, no r/m/g Pulm: CTAB, normal work of breathing Abdomen: Bowel sounds normal. Abdomen soft and non-tender.  Pelvic Exam chaperoned by [facility] April Zimmerman        External: normal male genitalia without lesions, urethral discharge or masses,no lymphadenopathy        ASSESSMENT/PLAN:   Chest pain Likely MSK etiology given pain reproducible. Risk factors for CAD include smoking history, low risk. -Ekg SB with SA,  no changes from previous -Continue Tylenol and Ibuprofen as needed -Follow up with PCP if no improvement in symptoms -Strict return precautions provided  Screen for STD (sexually transmitted disease) Urine for G/C&C U/A today HIV,RPR,Hep C labs today Follow up with PCP as needed        May, MD [facility]

## 2020-08-13 LAB — HCV AB W REFLEX TO QUANT PCR: HCV Ab: <0.1 s/co ratio (ref 0.0–0.9)

## 2020-08-13 LAB — URINE CYTOLOGY ANCILLARY ONLY
Chlamydia: NEGATIVE
Comment: NEGATIVE
Comment: NEGATIVE
Comment: NORMAL
Neisseria Gonorrhea: NEGATIVE
Trichomonas: NEGATIVE

## 2020-08-13 LAB — RPR: RPR Ser Ql: NONREACTIVE

## 2020-08-13 LAB — HIV ANTIBODY (ROUTINE TESTING W REFLEX): HIV Screen 4th Generation wRfx: NONREACTIVE

## 2020-08-13 LAB — HCV INTERPRETATION

## 2020-08-13 NOTE — Progress Notes (Signed)
Please call patient and let him know that his STI testing was negative and his HIV and hepatitis c test was negative.  I am still waiting for his urine culture to be resulted.  Thanks Kenney Houseman

## 2020-08-14 ENCOUNTER — Telehealth: Payer: Self-pay | Admitting: *Deleted

## 2020-08-14 LAB — URINE CULTURE: Organism ID, Bacteria: NO GROWTH

## 2020-08-14 NOTE — Telephone Encounter (Signed)
Pt informed of below.Scott Wheeler, CMA ? ?

## 2020-08-14 NOTE — Telephone Encounter (Signed)
-----   Message from Dana Allan, MD sent at 08/14/2020  9:39 AM EST ----- Please let patient know his urine culture came back positive and he does not have any urine infection. If his symptoms continue please have him make an appointment with his PCP.  Thanks  Kenney Houseman

## 2020-08-14 NOTE — Telephone Encounter (Signed)
-----   Message from Dana Allan, MD sent at 08/13/2020 10:29 PM EST ----- Please call patient and let him know that his STI testing was negative and his HIV and hepatitis c test was negative.  I am still waiting for his urine culture to be resulted.  Thanks Kenney Houseman

## 2020-08-14 NOTE — Telephone Encounter (Signed)
LVM for pt to call office back to inform him of below. Temima Kutsch Zimmerman Rumple, CMA

## 2020-08-14 NOTE — Progress Notes (Signed)
Please let patient know his urine culture came back positive and he does not have any urine infection. If his symptoms continue please have him make an appointment with his PCP.  Thanks  Kenney Houseman

## 2020-08-16 ENCOUNTER — Encounter: Payer: Self-pay | Admitting: Family Medicine

## 2020-08-16 DIAGNOSIS — R079 Chest pain, unspecified: Secondary | ICD-10-CM | POA: Insufficient documentation

## 2020-08-16 NOTE — Assessment & Plan Note (Signed)
Likely MSK etiology given pain reproducible. Risk factors for CAD include smoking history, low risk. -Ekg SB with SA,  no changes from previous -Continue Tylenol and Ibuprofen as needed -Follow up with PCP if no improvement in symptoms -Strict return precautions provided

## 2020-08-16 NOTE — Assessment & Plan Note (Signed)
Urine for G/C&C U/A today HIV,RPR,Hep C labs today Follow up with PCP as needed

## 2021-07-22 NOTE — Progress Notes (Signed)
° ° °  SUBJECTIVE:   Chief compliant/HPI: annual examination  Scott Wheeler is a 30 y.o. who presents today for an annual exam.  Current Concern:  Concern for sexually transmitted infection: Patient is a 30 y.o. male presenting for concern for sexually transmitted infection.  Patient states that his wife has a positive for trichomonas 1 to 2 weeks ago.  They have not been sexually active together he like to be tested today.  He declines blood work testing today to assess for HIV, hepatitis, syphilis. He denies any urinary symptoms, penile discharge.   Reviewed and updated history.   Review of systems negative.    OBJECTIVE:   BP 122/80    Pulse 60    Ht 6' (1.829 m)    Wt 143 lb (64.9 kg)    SpO2 99%    BMI 19.39 kg/m   General: Awake, alert, oriented, in no acute distress, pleasant and cooperative with examination HEENT: Normocephalic, atraumatic, nares patent, dentition is good, oropharynx without erythema or exudates, TM's clear bilaterally, no thyroid nodules palpated Cardio: RRR without murmur, 2+ radial, DP and PT pulses b/l Respiratory: CTAB without wheezing/rhonchi/rales Abdomen: Soft, non-tender to palpation of all quadrants, non-distended, no rebound/guarding, no organomegaly MSK: Able to move all extremities spontaneously, good muscle strength, no abnormalities Extremities: without edema or cyanosis Neuro: Speech is clear and intact, no focal deficits, no facial asymmetry, follows commands  Psych: Normal mood and affect  Depression screen [facility] 2/9 07/23/2021 08/12/2020 04/26/2020  Decreased Interest 1 1 0  Down, Depressed, Hopeless 1 1 0  PHQ - 2 Score 2 2 0  Altered sleeping 2 1 1   Tired, decreased energy 1 1 0  Change in appetite 1 1 0  Feeling bad or failure about yourself  0 1 0  Trouble concentrating 1 1 1   Moving slowly or fidgety/restless 2 1 0  Suicidal thoughts 0 0 0  PHQ-9 Score 9 8 2   Difficult doing work/chores Not difficult at all - Not difficult at all      ASSESSMENT/PLAN:   Exposure to STD Recent exposure to trichomonas as his wife tested positive.  Urine sample collected today, though will treat empirically as I suspect he is positive.  Will call patient if negative.  -Metronidazole 500 mg BID x 7 days -F/u urine cytology; treat other STI as indicated  -Declined blood testing today    Annual Examination  See AVS for age appropriate recommendations  PHQ score 9, reviewed. Blood pressure reviewed and near goal, 122/80, goal <120/80   Advanced directive: not discussed today   Considered the following items based upon USPSTF recommendations: HIV testing: declined Hepatitis C: declined Hepatitis B: declined Syphilis if at high risk: {declined GC/CTrequested Lipid panel (nonfasting or fasting) discussed based upon AHA recommendations and not ordered.  Consider repeat every 4-6 years.  Reviewed risk factors for latent tuberculosis and not indicated Immunizations None   Follow up in 1 year or sooner if indicated.    , DO [place] [facility]

## 2021-07-23 ENCOUNTER — Other Ambulatory Visit: Payer: Self-pay

## 2021-07-23 ENCOUNTER — Ambulatory Visit (INDEPENDENT_AMBULATORY_CARE_PROVIDER_SITE_OTHER): Payer: 59 | Admitting: Family Medicine

## 2021-07-23 ENCOUNTER — Other Ambulatory Visit (HOSPITAL_COMMUNITY)
Admission: RE | Admit: 2021-07-23 | Discharge: 2021-07-23 | Disposition: A | Discharge status: Home / Self Care | Payer: 59 | Source: Ambulatory Visit | Attending: Family Medicine | Admitting: Family Medicine

## 2021-07-23 VITALS — BP 122/80 | HR 60 | Ht 72.0 in | Wt 143.0 lb

## 2021-07-23 DIAGNOSIS — Z113 Encounter for screening for infections with a predominantly sexual mode of transmission: Secondary | ICD-10-CM | POA: Diagnosis present

## 2021-07-23 DIAGNOSIS — Z202 Contact with and (suspected) exposure to infections with a predominantly sexual mode of transmission: Secondary | ICD-10-CM | POA: Diagnosis not present

## 2021-07-23 MED ORDER — METRONIDAZOLE 500 MG PO TABS: 500.0000 mg | ORAL_TABLET | Freq: Two times a day (BID) | ORAL | 0 refills | Status: AC

## 2021-07-23 NOTE — Assessment & Plan Note (Signed)
Recent exposure to trichomonas as his wife tested positive.  Urine sample collected today, though will treat empirically as I suspect he is positive.  Will call patient if negative.  -Metronidazole 500 mg BID x 7 days -F/u urine cytology; treat other STI as indicated  -Declined blood testing today

## 2021-07-23 NOTE — Patient Instructions (Signed)
It was wonderful to see you today.  Please bring ALL of your medications with you to every visit.   Today we talked about:  We are doing lab work today to check for sexually transmitted infections. I am sending a prescription to your pharmacy as I suspect you will be positive for Trichomonas because of your positive contact. If you test negative, I will let you know and you don't have to take the medication.  Today at your annual preventive visit we talked about the following measures: I recommend 150 minutes of exercise per week-try 30 minutes 5 days per week We discussed reducing sugary beverages (like soda and juice) and increasing leafy greens and whole fruits.  We discussed avoiding tobacco and alcohol.  I recommend avoiding illicit substances.  Your blood pressure is 122/80 at goal of <120/80.   Tobacco use is damaging to your body. It increases your risk of stroke, heart attack, lung cancer, and serious lung disease in the future. It also reduces your fertility.   Quitting tobacco is the best thing for your health but is a challenge---nicotine, a chemical in cigarettes, is highly addictive.   You can call 1 800 QUIT NOW [phone])088-3087)---you will be connected with a Careers information officer. They can also mail you nicotine gums, lozenges, and patches to quit.   Ask me about patches (which you wear all day) and gums (which you use when you have a craving) to help you quit.   There are safe, effective medications to help you quit--  Varencline---also called Chantix---- is the most common medication used to help people stop smoking. It starts a low dose and is increased. I recommended choosing a quit date then starting the medication 8 days before this. Side effects include mild headache, difficulty sleeping, and odd dreams. The medication is typically very well tolerated.     Bupropion---also called Zyban---- is started 1 week before your quit date. You take 1 pill for three days then  increase to 1 pill twice per day. Side effects include a mild headache and anxiety---this usually goes away. Some patients experience weight loss.      Thank you for choosing [facility].   Please call [phone] with any questions about today's appointment.  Please be sure to schedule follow up at the front  desk before you leave today.   Sabino Dick, DO PGY-2 Family Medicine

## 2021-07-24 LAB — URINE CYTOLOGY ANCILLARY ONLY
Chlamydia: NEGATIVE
Comment: NEGATIVE
Comment: NEGATIVE
Comment: NORMAL
Neisseria Gonorrhea: NEGATIVE
Trichomonas: POSITIVE — AB

## 2021-08-15 ENCOUNTER — Emergency Department (INDEPENDENT_AMBULATORY_CARE_PROVIDER_SITE_OTHER): Payer: 59

## 2021-08-15 ENCOUNTER — Emergency Department (INDEPENDENT_AMBULATORY_CARE_PROVIDER_SITE_OTHER)
Admission: EM | Admit: 2021-08-15 | Discharge: 2021-08-15 | Disposition: A | Discharge status: Home / Self Care | Payer: 59 | Source: Home / Self Care

## 2021-08-15 ENCOUNTER — Encounter: Payer: Self-pay | Admitting: Emergency Medicine

## 2021-08-15 ENCOUNTER — Other Ambulatory Visit: Payer: Self-pay

## 2021-08-15 DIAGNOSIS — M65832 Other synovitis and tenosynovitis, left forearm: Secondary | ICD-10-CM

## 2021-08-15 DIAGNOSIS — M25532 Pain in left wrist: Secondary | ICD-10-CM | POA: Diagnosis not present

## 2021-08-15 MED ORDER — BACLOFEN 10 MG PO TABS: 10.0000 mg | ORAL_TABLET | Freq: Three times a day (TID) | ORAL | 0 refills | Status: DC

## 2021-08-15 MED ORDER — PREDNISONE 20 MG PO TABS: ORAL_TABLET | ORAL | 0 refills | Status: DC

## 2021-08-15 MED ORDER — CELECOXIB 100 MG PO CAPS
100.0000 mg | ORAL_CAPSULE | Freq: Two times a day (BID) | ORAL | 0 refills | Status: AC
Start: 2021-08-15 — End: 2021-08-30

## 2021-08-15 NOTE — Discharge Instructions (Addendum)
Advised/inform patient of left wrist x-ray results.  Advised/instructed placement to wear left wrist brace 24/7 (except when bathing) for the next 2 weeks.  Advised patient to take medication as directed with food to completion.  Advised patient may take baclofen daily or as needed.  Advised/encouraged patient to avoid repetitive movement activities, moderate to heavy lifting, pushing, pulling, with left hand/left wrist for the next 7 to 10 days.

## 2021-08-15 NOTE — ED Triage Notes (Signed)
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

## 2021-08-15 NOTE — ED Provider Notes (Signed)
[facility]    CSN: [id number] Arrival date & time: 08/15/21  0900      History   Chief Complaint Chief Complaint  Patient presents with   Wrist Pain    HPI Scott Wheeler is a 31 y.o. male.   HPI 31 year old male presents with left wrist pain swelling for 3 days.  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  Past Medical History:  Diagnosis Date   Bronchitis    Hypertension    Kidney calculi     Patient Active Problem List   Diagnosis Date Noted   Chest pain 08/16/2020   Shoulder pain 04/27/2020   Screen for STD (sexually transmitted disease) 04/27/2020   Emesis 09/04/2019   Trichomoniasis 03/14/2019   Chronic midline low back pain without sciatica 04/28/2018   Exposure to STD 02/21/2017    History reviewed. No pertinent surgical history.     Home Medications    Prior to Admission medications   Medication Sig Start Date End Date Taking? Authorizing Provider  baclofen (LIORESAL) 10 MG tablet Take 1 tablet (10 mg total) by mouth 3 (three) times daily. 08/15/21  Yes Trevor Iha, FNP  celecoxib (CELEBREX) 100 MG capsule Take 1 capsule (100 mg total) by mouth 2 (two) times daily for 15 days. 08/15/21 08/30/21 Yes Trevor Iha, FNP  predniSONE (DELTASONE) 20 MG tablet Take 3 tabs PO daily x 3 days, then 2 tabs PO daily x 3 days, then 1 tab PO daily x 3 days 08/15/21  Yes Trevor Iha, FNP    Family History Family History  Problem Relation Age of Onset   Hypertension Mother     Social History Social History   Tobacco Use   Smoking status: Every Day    Types: Cigarettes   Smokeless tobacco: Never  Vaping Use   Vaping Use: Never used  Substance Use Topics   Alcohol use: Yes   Drug use: Yes    Types: Marijuana     Allergies   Strawberry extract   Review of Systems Review of Systems  Musculoskeletal:        Left wrist swelling for 3 days    Physical Exam Triage Vital Signs ED Triage Vitals  Enc Vitals Group     BP      Pulse       Resp      Temp      Temp src      SpO2      Weight      Height      Head Circumference      Peak Flow      Pain Score      Pain Loc      Pain Edu?      Excl. in GC?    No data found.  Updated Vital Signs BP (!) 143/90 (BP Location: Right Arm)    Pulse 66    Temp 98 F (36.7 C) (Oral)    Resp 16    Ht 6' (1.829 m)    Wt 144 lb (65.3 kg)    SpO2 98%    BMI 19.53 kg/m    Physical Exam Constitutional:      Appearance: Normal appearance. He is normal weight.  HENT:     Head: Normocephalic and atraumatic.     Mouth/Throat:     Mouth: Mucous membranes are moist.     Pharynx: Oropharynx is clear.  Eyes:     Extraocular Movements: Extraocular movements intact.  Conjunctiva/sclera: Conjunctivae normal.     Pupils: Pupils are equal, round, and reactive to light.  Cardiovascular:     Rate and Rhythm: Normal rate and regular rhythm.     Pulses: Normal pulses.     Heart sounds: Normal heart sounds.  Pulmonary:     Effort: Pulmonary effort is normal.     Breath sounds: Normal breath sounds.  Musculoskeletal:     Cervical back: Normal range of motion and neck supple.     Comments: Left wrist: Moderate soft tissue swelling noted over distal radius, exam limited due to pain unable to perform Finkelstein's or ulnar deviation, positive Tinel's sign, inability to make fist or abduct thumb/fingers  Skin:    General: Skin is warm and dry.  Neurological:     General: No focal deficit present.     Mental Status: He is alert and oriented to person, place, and time.     UC Treatments / Results  Labs (all labs ordered are listed, but only abnormal results are displayed) Labs Reviewed - No data to display  EKG   Radiology DG Wrist Complete Left  Result Date: 08/15/2021 CLINICAL DATA:  Left wrist pain EXAM: LEFT WRIST - COMPLETE 3+ VIEW COMPARISON:  None. FINDINGS: There is no acute osseous abnormality. There is a well corticated bony fragment along the dorsal wrist,  consistent with old injury. There is soft tissue swelling of the hand and wrist. IMPRESSION: Soft tissue swelling of the hand and wrist. No acute osseous abnormality. Electronically Signed   By: Caprice Renshaw M.D.   On: 08/15/2021 10:38    Procedures Procedures (including critical care time)  Medications Ordered in UC Medications - No data to display  Initial Impression / Assessment and Plan / UC Course  I have reviewed the triage vital signs and the nursing notes.  Pertinent labs & imaging results that were available during my care of the patient were reviewed by me and considered in my medical decision making (see chart for details).     MDM: 1.  Left wrist pain-left wrist x-ray was negative for acute osseous process; 2.  Extensor tenosynovitis of left wrist-left wrist brace placed on patient's left wrist prior to discharge, Rx'd Prednisone, Celebrex, and Baclofen. Advised/inform patient of left wrist x-ray results.  Advised/instructed placement to wear left wrist brace 24/7 (except when bathing) for the next 2 weeks.  Advised patient to take medication as directed with food to completion.  Advised patient may take Baclofen daily or as needed.  Advised/encouraged patient to avoid repetitive movement activities, moderate to heavy lifting, pushing, pulling, with left hand/left wrist for the next 7 to 10 days.  Work note provided to patient prior to discharge.  Patient discharged home, hemodynamically stable. Final Clinical Impressions(s) / UC Diagnoses   Final diagnoses:  Left wrist pain  Extensor tenosynovitis of left wrist     Discharge Instructions      Advised/inform patient of left wrist x-ray results.  Advised/instructed placement to wear left wrist brace 24/7 (except when bathing) for the next 2 weeks.  Advised patient to take medication as directed with food to completion.  Advised patient may take baclofen daily or as needed.  Advised/encouraged patient to avoid repetitive movement  activities, moderate to heavy lifting, pushing, pulling, with left hand/left wrist for the next 7 to 10 days.     ED Prescriptions     Medication Sig Dispense Auth. Provider   predniSONE (DELTASONE) 20 MG tablet Take 3 tabs PO  daily x 3 days, then 2 tabs PO daily x 3 days, then 1 tab PO daily x 3 days 18 tablet Trevor Ihaagan, Rebacca Votaw, FNP   baclofen (LIORESAL) 10 MG tablet Take 1 tablet (10 mg total) by mouth 3 (three) times daily. 30 each Trevor Ihaagan, Kindell Strada, FNP   celecoxib (CELEBREX) 100 MG capsule Take 1 capsule (100 mg total) by mouth 2 (two) times daily for 15 days. 30 capsule Trevor Ihaagan, Kenzee Bassin, FNP      PDMP not reviewed this encounter.   Trevor IhaRagan, Marica Trentham, FNP 08/15/21 1136

## 2022-01-06 ENCOUNTER — Encounter: Payer: Self-pay | Admitting: *Deleted

## 2022-07-01 DIAGNOSIS — Z9889 Other specified postprocedural states: Secondary | ICD-10-CM | POA: Diagnosis not present

## 2022-07-01 DIAGNOSIS — K649 Unspecified hemorrhoids: Secondary | ICD-10-CM | POA: Diagnosis not present

## 2022-07-07 ENCOUNTER — Ambulatory Visit
Admission: EM | Admit: 2022-07-07 | Discharge: 2022-07-07 | Disposition: A | Discharge status: Home / Self Care | Payer: 59 | Attending: Family Medicine | Admitting: Family Medicine

## 2022-07-07 DIAGNOSIS — M654 Radial styloid tenosynovitis [de Quervain]: Secondary | ICD-10-CM | POA: Diagnosis not present

## 2022-07-07 MED ORDER — HYDROCODONE-ACETAMINOPHEN 7.5-325 MG PO TABS: 1.0000 | ORAL_TABLET | Freq: Four times a day (QID) | ORAL | 0 refills | Status: AC | PRN

## 2022-07-07 NOTE — ED Provider Notes (Signed)
[facility]    CSN: [id number] Arrival date & time: 07/07/22  1432      History   Chief Complaint Chief Complaint  Patient presents with   Arm Swelling    Left arm swelling    HPI Scott Wheeler is a 31 y.o. male.   HPI  Was seen for tenosynovitis of his wrist in January of this year.  He states he has had it off and on ever since that time.  Currently it is very swollen and painful.  He states that he has not had any known injury however he does use his hands and arms a lot at work.  He states it got briefly better when he took prednisone, but then it comes back.  It is always worse after working.  Right now he can hardly grasp with his left arm  Past Medical History:  Diagnosis Date   Bronchitis    Hypertension    Kidney calculi     Patient Active Problem List   Diagnosis Date Noted   Chest pain 08/16/2020   Shoulder pain 04/27/2020   Screen for STD (sexually transmitted disease) 04/27/2020   Emesis 09/04/2019   Trichomoniasis 03/14/2019   Chronic midline low back pain without sciatica 04/28/2018   Exposure to STD 02/21/2017    Past Surgical History:  Procedure Laterality Date   HEMORROIDECTOMY         Home Medications    Prior to Admission medications   Medication Sig Start Date End Date Taking? Authorizing Provider  HYDROcodone-acetaminophen (NORCO) 7.5-325 MG tablet Take 1 tablet by mouth every 6 (six) hours as needed for moderate pain. 07/07/22  Yes Eustace Moore, MD    Family History Family History  Problem Relation Age of Onset   Hypertension Mother     Social History Social History   Tobacco Use   Smoking status: Every Day    Types: Cigarettes   Smokeless tobacco: Never  Vaping Use   Vaping Use: Never used  Substance Use Topics   Alcohol use: Yes   Drug use: Yes    Types: Marijuana     Allergies   Strawberry extract   Review of Systems Review of Systems  See HPI Physical Exam Triage Vital Signs ED  Triage Vitals  Enc Vitals Group     BP 07/07/22 1454 (!) 141/87     Pulse Rate 07/07/22 1454 83     Resp 07/07/22 1454 18     Temp 07/07/22 1454 98.6 F (37 C)     Temp Source 07/07/22 1454 Oral     SpO2 07/07/22 1454 96 %     Weight 07/07/22 1452 145 lb (65.8 kg)     Height 07/07/22 1452 6' (1.829 m)     Head Circumference --      Peak Flow --      Pain Score 07/07/22 1452 0     Pain Loc --      Pain Edu? --      Excl. in GC? --    No data found.  Updated Vital Signs BP (!) 141/87 (BP Location: Left Arm)   Pulse 83   Temp 98.6 F (37 C) (Oral)   Resp 18   Ht 6' (1.829 m)   Wt 65.8 kg   SpO2 96%   BMI 19.67 kg/m        Physical Exam Constitutional:      General: He is not in acute distress.  Appearance: He is well-developed.  HENT:     Head: Normocephalic and atraumatic.  Eyes:     Conjunctiva/sclera: Conjunctivae normal.     Pupils: Pupils are equal, round, and reactive to light.  Cardiovascular:     Rate and Rhythm: Normal rate.  Pulmonary:     Effort: Pulmonary effort is normal. No respiratory distress.  Abdominal:     General: There is no distension.     Palpations: Abdomen is soft.  Musculoskeletal:        General: Swelling and deformity present. Normal range of motion.     Cervical back: Normal range of motion.     Comments: The left wrist has a fairly large swelling/effusion in the extensor tendon sheath over the distal radius, very tender.  Pain with wrist movement.  Pain with thumb flexion extension.  Skin:    General: Skin is warm and dry.  Neurological:     Mental Status: He is alert.      UC Treatments / Results  Labs (all labs ordered are listed, but only abnormal results are displayed) Labs Reviewed - No data to display  EKG   Radiology No results found.  Procedures Procedures (including critical care time)  Medications Ordered in UC Medications - No data to display  Initial Impression / Assessment and Plan / UC Course  I  have reviewed the triage vital signs and the nursing notes.  Pertinent labs & imaging results that were available during my care of the patient were reviewed by me and considered in my medical decision making (see chart for details).     Discussed with Dr. Dianah Field Final Clinical Impressions(s) / UC Diagnoses   Final diagnoses:  Radial styloid tenosynovitis (de quervain)     Discharge Instructions      Put ice on wrist for 20 minutes every couple hours Take ibuprofen as needed for pain Wear brace as much as you can, may remove for washing See Dr. Dianah Field on Friday.  You must call his office to set up an appointment, he has an opening at 1130   ED Prescriptions     Medication Sig Dispense Auth. Provider   HYDROcodone-acetaminophen (NORCO) 7.5-325 MG tablet Take 1 tablet by mouth every 6 (six) hours as needed for moderate pain. 10 tablet Raylene Everts, MD      I have reviewed the PDMP during this encounter.   Raylene Everts, MD 07/07/22 [phone]

## 2022-07-07 NOTE — Discharge Instructions (Addendum)
Put ice on wrist for 20 minutes every couple hours Take ibuprofen as needed for pain Wear brace as much as you can, may remove for washing See Dr. Benjamin Stain on Friday.  You must call his office to set up an appointment, he has an opening at 1130

## 2022-07-07 NOTE — ED Triage Notes (Signed)
Pt states that he has some ongoing left arm swelling.  No known injury

## 2022-07-10 ENCOUNTER — Encounter: Payer: 59 | Admitting: Sports Medicine

## 2023-06-06 DIAGNOSIS — G500 Trigeminal neuralgia: Secondary | ICD-10-CM | POA: Diagnosis not present

## 2023-06-06 DIAGNOSIS — R202 Paresthesia of skin: Secondary | ICD-10-CM | POA: Diagnosis not present

## 2023-06-06 DIAGNOSIS — R200 Anesthesia of skin: Secondary | ICD-10-CM | POA: Diagnosis not present

## 2024-04-04 ENCOUNTER — Encounter: Payer: Self-pay | Admitting: Sports Medicine
